# Patient Record
Sex: Male | Born: 1940 | Race: White | Hispanic: No | Marital: Married | Smoking: Former smoker
Health system: Southern US, Community
[De-identification: ages and names within clinical notes are randomized; demographics above are authoritative.]

## PROBLEM LIST (undated history)

## (undated) DIAGNOSIS — I519 Heart disease, unspecified: Secondary | ICD-10-CM

## (undated) DIAGNOSIS — E785 Hyperlipidemia, unspecified: Secondary | ICD-10-CM

## (undated) DIAGNOSIS — I10 Essential (primary) hypertension: Secondary | ICD-10-CM

## (undated) DIAGNOSIS — N400 Enlarged prostate without lower urinary tract symptoms: Secondary | ICD-10-CM

## (undated) DIAGNOSIS — K279 Peptic ulcer, site unspecified, unspecified as acute or chronic, without hemorrhage or perforation: Secondary | ICD-10-CM

## (undated) DIAGNOSIS — K5190 Ulcerative colitis, unspecified, without complications: Secondary | ICD-10-CM

## (undated) DIAGNOSIS — E119 Type 2 diabetes mellitus without complications: Secondary | ICD-10-CM

## (undated) DIAGNOSIS — K861 Other chronic pancreatitis: Secondary | ICD-10-CM

## (undated) DIAGNOSIS — M109 Gout, unspecified: Secondary | ICD-10-CM

## (undated) HISTORY — PX: APPENDECTOMY: SHX54

## (undated) HISTORY — DX: Peptic ulcer, site unspecified, unspecified as acute or chronic, without hemorrhage or perforation: K27.9

## (undated) HISTORY — PX: CHOLECYSTECTOMY: SHX55

## (undated) HISTORY — PX: CORONARY ARTERY BYPASS GRAFT: SHX141

## (undated) HISTORY — DX: Heart disease, unspecified: I51.9

## (undated) HISTORY — DX: Hyperlipidemia, unspecified: E78.5

## (undated) HISTORY — DX: Gout, unspecified: M10.9

## (undated) HISTORY — DX: Other chronic pancreatitis: K86.1

## (undated) HISTORY — DX: Ulcerative colitis, unspecified, without complications: K51.90

## (undated) HISTORY — DX: Benign prostatic hyperplasia without lower urinary tract symptoms: N40.0

---

## 2004-09-24 ENCOUNTER — Ambulatory Visit: Payer: Self-pay | Admitting: Gastroenterology

## 2005-10-21 ENCOUNTER — Ambulatory Visit: Payer: Self-pay | Admitting: Gastroenterology

## 2006-01-24 ENCOUNTER — Ambulatory Visit: Payer: Self-pay | Admitting: Gastroenterology

## 2006-08-04 ENCOUNTER — Ambulatory Visit: Payer: Self-pay | Admitting: Gastroenterology

## 2006-08-15 ENCOUNTER — Inpatient Hospital Stay: Payer: Self-pay | Admitting: Unknown Physician Specialty

## 2006-10-09 ENCOUNTER — Ambulatory Visit: Payer: Self-pay | Admitting: Internal Medicine

## 2006-10-11 ENCOUNTER — Inpatient Hospital Stay: Payer: Self-pay | Admitting: Internal Medicine

## 2006-10-31 ENCOUNTER — Ambulatory Visit: Payer: Self-pay | Admitting: Internal Medicine

## 2006-11-08 ENCOUNTER — Ambulatory Visit: Payer: Self-pay | Admitting: Internal Medicine

## 2006-12-22 ENCOUNTER — Ambulatory Visit: Payer: Self-pay | Admitting: Internal Medicine

## 2007-04-16 ENCOUNTER — Ambulatory Visit: Payer: Self-pay | Admitting: Unknown Physician Specialty

## 2008-12-25 ENCOUNTER — Ambulatory Visit: Payer: Self-pay | Admitting: Gastroenterology

## 2009-06-30 ENCOUNTER — Ambulatory Visit: Payer: Self-pay | Admitting: Unknown Physician Specialty

## 2009-07-20 ENCOUNTER — Ambulatory Visit: Payer: Self-pay | Admitting: Family

## 2010-06-29 ENCOUNTER — Ambulatory Visit: Payer: Self-pay | Admitting: Unknown Physician Specialty

## 2010-10-04 ENCOUNTER — Ambulatory Visit: Payer: Self-pay | Admitting: Internal Medicine

## 2011-06-28 ENCOUNTER — Ambulatory Visit: Payer: Self-pay | Admitting: Unknown Physician Specialty

## 2011-06-29 LAB — PATHOLOGY REPORT

## 2011-07-23 ENCOUNTER — Emergency Department: Payer: Self-pay | Admitting: Internal Medicine

## 2011-07-23 LAB — URINALYSIS, COMPLETE
Bacteria: NONE SEEN
Bilirubin,UR: NEGATIVE
Blood: NEGATIVE
Glucose,UR: 150 mg/dL
Ketone: NEGATIVE
Leukocyte Esterase: NEGATIVE
Nitrite: NEGATIVE
Ph: 6
Protein: NEGATIVE
RBC,UR: 1 /HPF
Specific Gravity: 1.015
Squamous Epithelial: NONE SEEN
WBC UR: <1 /HPF

## 2011-08-30 ENCOUNTER — Ambulatory Visit: Payer: Self-pay | Admitting: Urology

## 2011-08-30 LAB — CBC WITH DIFFERENTIAL/PLATELET
Basophil #: 0.1 10*3/uL (ref 0.0–0.1)
Basophil %: 0.7 %
HCT: 40.1 % (ref 40.0–52.0)
HGB: 12.7 g/dL — ABNORMAL LOW (ref 13.0–18.0)
Lymphocyte %: 21.8 %
MCH: 28.9 pg (ref 26.0–34.0)
MCHC: 31.6 g/dL — ABNORMAL LOW (ref 32.0–36.0)
Monocyte %: 7.5 %
Neutrophil #: 7.7 10*3/uL — ABNORMAL HIGH (ref 1.4–6.5)
Platelet: 194 10*3/uL (ref 150–440)
RDW: 13.4 % (ref 11.5–14.5)
WBC: 11.3 10*3/uL — ABNORMAL HIGH (ref 3.8–10.6)

## 2011-08-30 LAB — BASIC METABOLIC PANEL
Anion Gap: 4 — ABNORMAL LOW (ref 7–16)
Calcium, Total: 9.2 mg/dL (ref 8.5–10.1)
Co2: 32 mmol/L (ref 21–32)
EGFR (African American): >60
EGFR (Non-African Amer.): >60
Glucose: 146 mg/dL — ABNORMAL HIGH (ref 65–99)
Osmolality: 287 (ref 275–301)
Potassium: 4.5 mmol/L (ref 3.5–5.1)
Sodium: 142 mmol/L (ref 136–145)

## 2011-08-31 ENCOUNTER — Ambulatory Visit: Payer: Self-pay | Admitting: Anesthesiology

## 2011-08-31 DIAGNOSIS — I10 Essential (primary) hypertension: Secondary | ICD-10-CM

## 2011-09-05 ENCOUNTER — Ambulatory Visit: Payer: Self-pay | Admitting: Urology

## 2011-10-14 DIAGNOSIS — D490 Neoplasm of unspecified behavior of digestive system: Secondary | ICD-10-CM | POA: Insufficient documentation

## 2011-11-02 ENCOUNTER — Other Ambulatory Visit: Payer: Self-pay | Admitting: Gastroenterology

## 2011-11-02 LAB — CLOSTRIDIUM DIFFICILE BY PCR

## 2012-09-12 ENCOUNTER — Encounter (INDEPENDENT_AMBULATORY_CARE_PROVIDER_SITE_OTHER): Payer: Medicare Other | Admitting: Ophthalmology

## 2012-09-12 DIAGNOSIS — E1139 Type 2 diabetes mellitus with other diabetic ophthalmic complication: Secondary | ICD-10-CM

## 2012-09-12 DIAGNOSIS — H43819 Vitreous degeneration, unspecified eye: Secondary | ICD-10-CM

## 2012-09-12 DIAGNOSIS — H348192 Central retinal vein occlusion, unspecified eye, stable: Secondary | ICD-10-CM

## 2012-09-12 DIAGNOSIS — E11319 Type 2 diabetes mellitus with unspecified diabetic retinopathy without macular edema: Secondary | ICD-10-CM

## 2012-09-12 DIAGNOSIS — I10 Essential (primary) hypertension: Secondary | ICD-10-CM

## 2012-09-12 DIAGNOSIS — H35039 Hypertensive retinopathy, unspecified eye: Secondary | ICD-10-CM

## 2012-10-12 ENCOUNTER — Encounter (INDEPENDENT_AMBULATORY_CARE_PROVIDER_SITE_OTHER): Payer: Medicare Other | Admitting: Ophthalmology

## 2012-10-12 DIAGNOSIS — E1139 Type 2 diabetes mellitus with other diabetic ophthalmic complication: Secondary | ICD-10-CM

## 2012-10-12 DIAGNOSIS — E11319 Type 2 diabetes mellitus with unspecified diabetic retinopathy without macular edema: Secondary | ICD-10-CM

## 2012-10-12 DIAGNOSIS — H348192 Central retinal vein occlusion, unspecified eye, stable: Secondary | ICD-10-CM

## 2012-10-12 DIAGNOSIS — H43819 Vitreous degeneration, unspecified eye: Secondary | ICD-10-CM

## 2012-10-12 DIAGNOSIS — H35039 Hypertensive retinopathy, unspecified eye: Secondary | ICD-10-CM

## 2012-10-12 DIAGNOSIS — I10 Essential (primary) hypertension: Secondary | ICD-10-CM

## 2012-11-12 ENCOUNTER — Encounter (INDEPENDENT_AMBULATORY_CARE_PROVIDER_SITE_OTHER): Payer: Medicare Other | Admitting: Ophthalmology

## 2012-11-12 DIAGNOSIS — E1139 Type 2 diabetes mellitus with other diabetic ophthalmic complication: Secondary | ICD-10-CM

## 2012-11-12 DIAGNOSIS — E11319 Type 2 diabetes mellitus with unspecified diabetic retinopathy without macular edema: Secondary | ICD-10-CM

## 2012-11-12 DIAGNOSIS — H35039 Hypertensive retinopathy, unspecified eye: Secondary | ICD-10-CM

## 2012-11-12 DIAGNOSIS — I10 Essential (primary) hypertension: Secondary | ICD-10-CM

## 2012-11-12 DIAGNOSIS — H348192 Central retinal vein occlusion, unspecified eye, stable: Secondary | ICD-10-CM

## 2012-11-12 DIAGNOSIS — H43819 Vitreous degeneration, unspecified eye: Secondary | ICD-10-CM

## 2012-12-10 ENCOUNTER — Encounter (INDEPENDENT_AMBULATORY_CARE_PROVIDER_SITE_OTHER): Payer: Medicare Other | Admitting: Ophthalmology

## 2012-12-10 DIAGNOSIS — H348192 Central retinal vein occlusion, unspecified eye, stable: Secondary | ICD-10-CM

## 2012-12-10 DIAGNOSIS — H43819 Vitreous degeneration, unspecified eye: Secondary | ICD-10-CM

## 2012-12-10 DIAGNOSIS — E1139 Type 2 diabetes mellitus with other diabetic ophthalmic complication: Secondary | ICD-10-CM

## 2012-12-10 DIAGNOSIS — H35039 Hypertensive retinopathy, unspecified eye: Secondary | ICD-10-CM

## 2012-12-10 DIAGNOSIS — E11319 Type 2 diabetes mellitus with unspecified diabetic retinopathy without macular edema: Secondary | ICD-10-CM

## 2012-12-10 DIAGNOSIS — I10 Essential (primary) hypertension: Secondary | ICD-10-CM

## 2013-01-07 ENCOUNTER — Encounter (INDEPENDENT_AMBULATORY_CARE_PROVIDER_SITE_OTHER): Payer: Medicare Other | Admitting: Ophthalmology

## 2013-01-07 DIAGNOSIS — I10 Essential (primary) hypertension: Secondary | ICD-10-CM

## 2013-01-07 DIAGNOSIS — H348192 Central retinal vein occlusion, unspecified eye, stable: Secondary | ICD-10-CM

## 2013-01-07 DIAGNOSIS — E11319 Type 2 diabetes mellitus with unspecified diabetic retinopathy without macular edema: Secondary | ICD-10-CM

## 2013-01-07 DIAGNOSIS — E1139 Type 2 diabetes mellitus with other diabetic ophthalmic complication: Secondary | ICD-10-CM

## 2013-01-07 DIAGNOSIS — H43819 Vitreous degeneration, unspecified eye: Secondary | ICD-10-CM

## 2013-01-07 DIAGNOSIS — H35039 Hypertensive retinopathy, unspecified eye: Secondary | ICD-10-CM

## 2013-02-15 ENCOUNTER — Encounter (INDEPENDENT_AMBULATORY_CARE_PROVIDER_SITE_OTHER): Payer: Medicare Other | Admitting: Ophthalmology

## 2013-02-15 DIAGNOSIS — E1139 Type 2 diabetes mellitus with other diabetic ophthalmic complication: Secondary | ICD-10-CM

## 2013-02-15 DIAGNOSIS — D3130 Benign neoplasm of unspecified choroid: Secondary | ICD-10-CM

## 2013-02-15 DIAGNOSIS — H35039 Hypertensive retinopathy, unspecified eye: Secondary | ICD-10-CM

## 2013-02-15 DIAGNOSIS — E11319 Type 2 diabetes mellitus with unspecified diabetic retinopathy without macular edema: Secondary | ICD-10-CM

## 2013-02-15 DIAGNOSIS — I10 Essential (primary) hypertension: Secondary | ICD-10-CM

## 2013-02-15 DIAGNOSIS — E1165 Type 2 diabetes mellitus with hyperglycemia: Secondary | ICD-10-CM

## 2013-02-15 DIAGNOSIS — H348192 Central retinal vein occlusion, unspecified eye, stable: Secondary | ICD-10-CM

## 2013-03-28 ENCOUNTER — Encounter (INDEPENDENT_AMBULATORY_CARE_PROVIDER_SITE_OTHER): Payer: Medicare Other | Admitting: Ophthalmology

## 2013-03-28 DIAGNOSIS — H43819 Vitreous degeneration, unspecified eye: Secondary | ICD-10-CM

## 2013-03-28 DIAGNOSIS — I10 Essential (primary) hypertension: Secondary | ICD-10-CM

## 2013-03-28 DIAGNOSIS — H348192 Central retinal vein occlusion, unspecified eye, stable: Secondary | ICD-10-CM

## 2013-03-28 DIAGNOSIS — H35039 Hypertensive retinopathy, unspecified eye: Secondary | ICD-10-CM

## 2013-05-09 ENCOUNTER — Encounter (INDEPENDENT_AMBULATORY_CARE_PROVIDER_SITE_OTHER): Payer: Medicare Other | Admitting: Ophthalmology

## 2013-05-09 DIAGNOSIS — H43819 Vitreous degeneration, unspecified eye: Secondary | ICD-10-CM

## 2013-05-09 DIAGNOSIS — H348192 Central retinal vein occlusion, unspecified eye, stable: Secondary | ICD-10-CM

## 2013-05-09 DIAGNOSIS — H35039 Hypertensive retinopathy, unspecified eye: Secondary | ICD-10-CM

## 2013-05-09 DIAGNOSIS — D3130 Benign neoplasm of unspecified choroid: Secondary | ICD-10-CM

## 2013-05-09 DIAGNOSIS — I10 Essential (primary) hypertension: Secondary | ICD-10-CM

## 2013-05-09 DIAGNOSIS — E1165 Type 2 diabetes mellitus with hyperglycemia: Secondary | ICD-10-CM

## 2013-05-09 DIAGNOSIS — E1139 Type 2 diabetes mellitus with other diabetic ophthalmic complication: Secondary | ICD-10-CM

## 2013-05-09 DIAGNOSIS — H2510 Age-related nuclear cataract, unspecified eye: Secondary | ICD-10-CM

## 2013-06-20 ENCOUNTER — Inpatient Hospital Stay: Payer: Self-pay | Admitting: Internal Medicine

## 2013-06-20 ENCOUNTER — Ambulatory Visit: Payer: Self-pay | Admitting: Unknown Physician Specialty

## 2013-06-20 LAB — COMPREHENSIVE METABOLIC PANEL
ALBUMIN: 3.7 g/dL (ref 3.4–5.0)
ANION GAP: 11 (ref 7–16)
AST: 21 U/L (ref 15–37)
Alkaline Phosphatase: 95 U/L
BUN: 17 mg/dL (ref 7–18)
Bilirubin,Total: 0.6 mg/dL (ref 0.2–1.0)
CALCIUM: 9.4 mg/dL (ref 8.5–10.1)
CO2: 25 mmol/L (ref 21–32)
CREATININE: 1.21 mg/dL (ref 0.60–1.30)
Chloride: 102 mmol/L (ref 98–107)
EGFR (African American): >60
GFR CALC NON AF AMER: 58 — AB
Glucose: 180 mg/dL — ABNORMAL HIGH (ref 65–99)
Osmolality: 282 (ref 275–301)
POTASSIUM: 3.5 mmol/L (ref 3.5–5.1)
SGPT (ALT): 34 U/L (ref 12–78)
Sodium: 138 mmol/L (ref 136–145)
Total Protein: 7.5 g/dL (ref 6.4–8.2)

## 2013-06-20 LAB — URINALYSIS, COMPLETE
BACTERIA: NONE SEEN
BILIRUBIN, UR: NEGATIVE
Blood: NEGATIVE
LEUKOCYTE ESTERASE: NEGATIVE
NITRITE: POSITIVE
Ph: 6 (ref 4.5–8.0)
RBC,UR: 14 /HPF (ref 0–5)
SQUAMOUS EPITHELIAL: NONE SEEN
Specific Gravity: 1.013 (ref 1.003–1.030)
WBC UR: 5 /HPF (ref 0–5)

## 2013-06-20 LAB — CBC
HCT: 43.4 % (ref 40.0–52.0)
HGB: 14.2 g/dL (ref 13.0–18.0)
MCH: 30 pg (ref 26.0–34.0)
MCHC: 32.8 g/dL (ref 32.0–36.0)
MCV: 92 fL (ref 80–100)
PLATELETS: 141 10*3/uL — AB (ref 150–440)
RBC: 4.74 10*6/uL (ref 4.40–5.90)
RDW: 14.1 % (ref 11.5–14.5)
WBC: 11.1 10*3/uL — ABNORMAL HIGH (ref 3.8–10.6)

## 2013-06-20 LAB — APTT: ACTIVATED PTT: 28.1 s (ref 23.6–35.9)

## 2013-06-21 HISTORY — PX: TOTAL HIP ARTHROPLASTY: SHX124

## 2013-06-21 LAB — CBC WITH DIFFERENTIAL/PLATELET
Basophil #: 0 10*3/uL (ref 0.0–0.1)
Basophil %: 0.5 %
EOS ABS: 0.1 10*3/uL (ref 0.0–0.7)
EOS PCT: 0.8 %
HCT: 38 % — AB (ref 40.0–52.0)
HGB: 12.7 g/dL — AB (ref 13.0–18.0)
LYMPHS PCT: 22.1 %
Lymphocyte #: 1.8 10*3/uL (ref 1.0–3.6)
MCH: 30.2 pg (ref 26.0–34.0)
MCHC: 33.4 g/dL (ref 32.0–36.0)
MCV: 91 fL (ref 80–100)
MONOS PCT: 8.4 %
Monocyte #: 0.7 x10 3/mm (ref 0.2–1.0)
NEUTROS ABS: 5.5 10*3/uL (ref 1.4–6.5)
Neutrophil %: 68.2 %
Platelet: 128 10*3/uL — ABNORMAL LOW (ref 150–440)
RBC: 4.2 10*6/uL — ABNORMAL LOW (ref 4.40–5.90)
RDW: 14 % (ref 11.5–14.5)
WBC: 8.1 10*3/uL (ref 3.8–10.6)

## 2013-06-21 LAB — BASIC METABOLIC PANEL
ANION GAP: 5 — AB (ref 7–16)
BUN: 14 mg/dL (ref 7–18)
CHLORIDE: 106 mmol/L (ref 98–107)
Calcium, Total: 8.6 mg/dL (ref 8.5–10.1)
Co2: 27 mmol/L (ref 21–32)
Creatinine: 1.02 mg/dL (ref 0.60–1.30)
Glucose: 143 mg/dL — ABNORMAL HIGH (ref 65–99)
OSMOLALITY: 279 (ref 275–301)
POTASSIUM: 3.9 mmol/L (ref 3.5–5.1)
Sodium: 138 mmol/L (ref 136–145)

## 2013-06-22 LAB — CBC WITH DIFFERENTIAL/PLATELET
Basophil #: 0 10*3/uL (ref 0.0–0.1)
Basophil %: 0.4 %
EOS ABS: 0.1 10*3/uL (ref 0.0–0.7)
Eosinophil %: 0.8 %
HCT: 35.8 % — ABNORMAL LOW (ref 40.0–52.0)
HGB: 11.9 g/dL — AB (ref 13.0–18.0)
LYMPHS ABS: 1.4 10*3/uL (ref 1.0–3.6)
LYMPHS PCT: 15.9 %
MCH: 30.2 pg (ref 26.0–34.0)
MCHC: 33.2 g/dL (ref 32.0–36.0)
MCV: 91 fL (ref 80–100)
Monocyte #: 0.7 x10 3/mm (ref 0.2–1.0)
Monocyte %: 8.2 %
Neutrophil #: 6.4 10*3/uL (ref 1.4–6.5)
Neutrophil %: 74.7 %
PLATELETS: 135 10*3/uL — AB (ref 150–440)
RBC: 3.93 10*6/uL — ABNORMAL LOW (ref 4.40–5.90)
RDW: 14 % (ref 11.5–14.5)
WBC: 8.6 10*3/uL (ref 3.8–10.6)

## 2013-06-22 LAB — BASIC METABOLIC PANEL
ANION GAP: 3 — AB (ref 7–16)
BUN: 12 mg/dL (ref 7–18)
CALCIUM: 8 mg/dL — AB (ref 8.5–10.1)
Chloride: 105 mmol/L (ref 98–107)
Co2: 27 mmol/L (ref 21–32)
Creatinine: 1.19 mg/dL (ref 0.60–1.30)
EGFR (African American): >60
EGFR (Non-African Amer.): 59 — ABNORMAL LOW
Glucose: 153 mg/dL — ABNORMAL HIGH (ref 65–99)
OSMOLALITY: 273 (ref 275–301)
Potassium: 4.2 mmol/L (ref 3.5–5.1)
Sodium: 135 mmol/L — ABNORMAL LOW (ref 136–145)

## 2013-06-22 LAB — URINE CULTURE

## 2013-06-23 LAB — CBC WITH DIFFERENTIAL/PLATELET
BASOS ABS: 0 10*3/uL (ref 0.0–0.1)
Basophil %: 0.3 %
EOS PCT: 0.4 %
Eosinophil #: 0 10*3/uL (ref 0.0–0.7)
HCT: 31.5 % — ABNORMAL LOW (ref 40.0–52.0)
HGB: 10.6 g/dL — AB (ref 13.0–18.0)
Lymphocyte #: 1.1 10*3/uL (ref 1.0–3.6)
Lymphocyte %: 15 %
MCH: 30.3 pg (ref 26.0–34.0)
MCHC: 33.5 g/dL (ref 32.0–36.0)
MCV: 90 fL (ref 80–100)
Monocyte #: 0.8 x10 3/mm (ref 0.2–1.0)
Monocyte %: 10.5 %
NEUTROS PCT: 73.8 %
Neutrophil #: 5.3 10*3/uL (ref 1.4–6.5)
PLATELETS: 113 10*3/uL — AB (ref 150–440)
RBC: 3.48 10*6/uL — ABNORMAL LOW (ref 4.40–5.90)
RDW: 14.6 % — AB (ref 11.5–14.5)
WBC: 7.2 10*3/uL (ref 3.8–10.6)

## 2013-06-23 LAB — BASIC METABOLIC PANEL
ANION GAP: 5 — AB (ref 7–16)
BUN: 13 mg/dL (ref 7–18)
CALCIUM: 8.1 mg/dL — AB (ref 8.5–10.1)
CHLORIDE: 102 mmol/L (ref 98–107)
Co2: 27 mmol/L (ref 21–32)
Creatinine: 1.16 mg/dL (ref 0.60–1.30)
EGFR (African American): >60
GLUCOSE: 179 mg/dL — AB (ref 65–99)
Osmolality: 273 (ref 275–301)
Potassium: 4.1 mmol/L (ref 3.5–5.1)
SODIUM: 134 mmol/L — AB (ref 136–145)

## 2013-06-24 LAB — CBC
HCT: 29.4 % — ABNORMAL LOW (ref 40.0–52.0)
HGB: 9.9 g/dL — ABNORMAL LOW (ref 13.0–18.0)
MCH: 30.1 pg (ref 26.0–34.0)
MCHC: 33.5 g/dL (ref 32.0–36.0)
MCV: 90 fL (ref 80–100)
Platelet: 120 10*3/uL — ABNORMAL LOW (ref 150–440)
RBC: 3.28 10*6/uL — AB (ref 4.40–5.90)
RDW: 14 % (ref 11.5–14.5)
WBC: 5.9 10*3/uL (ref 3.8–10.6)

## 2013-06-25 LAB — BASIC METABOLIC PANEL
ANION GAP: 3 — AB (ref 7–16)
BUN: 13 mg/dL (ref 7–18)
CHLORIDE: 102 mmol/L (ref 98–107)
Calcium, Total: 8.1 mg/dL — ABNORMAL LOW (ref 8.5–10.1)
Co2: 31 mmol/L (ref 21–32)
Creatinine: 0.99 mg/dL (ref 0.60–1.30)
EGFR (Non-African Amer.): >60
Glucose: 79 mg/dL (ref 65–99)
OSMOLALITY: 271 (ref 275–301)
Potassium: 3.3 mmol/L — ABNORMAL LOW (ref 3.5–5.1)
Sodium: 136 mmol/L (ref 136–145)

## 2013-06-25 LAB — CBC WITH DIFFERENTIAL/PLATELET
BASOS ABS: 0 10*3/uL (ref 0.0–0.1)
Basophil %: 0.5 %
EOS PCT: 0.9 %
Eosinophil #: 0.1 10*3/uL (ref 0.0–0.7)
HCT: 25.8 % — ABNORMAL LOW (ref 40.0–52.0)
HGB: 8.8 g/dL — ABNORMAL LOW (ref 13.0–18.0)
Lymphocyte #: 1.7 10*3/uL (ref 1.0–3.6)
Lymphocyte %: 25.4 %
MCH: 30.6 pg (ref 26.0–34.0)
MCHC: 34 g/dL (ref 32.0–36.0)
MCV: 90 fL (ref 80–100)
Monocyte #: 0.7 x10 3/mm (ref 0.2–1.0)
Monocyte %: 10.3 %
NEUTROS ABS: 4.2 10*3/uL (ref 1.4–6.5)
NEUTROS PCT: 62.9 %
Platelet: 130 10*3/uL — ABNORMAL LOW (ref 150–440)
RBC: 2.87 10*6/uL — ABNORMAL LOW (ref 4.40–5.90)
RDW: 14.2 % (ref 11.5–14.5)
WBC: 6.6 10*3/uL (ref 3.8–10.6)

## 2013-06-25 LAB — PATHOLOGY REPORT

## 2013-06-26 ENCOUNTER — Encounter: Payer: Self-pay | Admitting: Internal Medicine

## 2013-06-26 LAB — URINALYSIS, COMPLETE
BILIRUBIN, UR: NEGATIVE
Bacteria: NONE SEEN
Blood: NEGATIVE
Glucose,UR: 50 mg/dL (ref 0–75)
Ketone: NEGATIVE
Leukocyte Esterase: NEGATIVE
Nitrite: NEGATIVE
Ph: 6 (ref 4.5–8.0)
Protein: NEGATIVE
Specific Gravity: 1.006 (ref 1.003–1.030)
WBC UR: 4 /HPF (ref 0–5)

## 2013-06-26 LAB — HEMOGLOBIN: HGB: 9.7 g/dL — AB (ref 13.0–18.0)

## 2013-06-26 LAB — POTASSIUM: Potassium: 4.1 mmol/L (ref 3.5–5.1)

## 2013-07-05 DIAGNOSIS — Z96649 Presence of unspecified artificial hip joint: Secondary | ICD-10-CM | POA: Insufficient documentation

## 2013-07-08 ENCOUNTER — Encounter (INDEPENDENT_AMBULATORY_CARE_PROVIDER_SITE_OTHER): Payer: Medicare Other | Admitting: Ophthalmology

## 2013-07-08 DIAGNOSIS — I10 Essential (primary) hypertension: Secondary | ICD-10-CM

## 2013-07-08 DIAGNOSIS — E1165 Type 2 diabetes mellitus with hyperglycemia: Secondary | ICD-10-CM

## 2013-07-08 DIAGNOSIS — E11319 Type 2 diabetes mellitus with unspecified diabetic retinopathy without macular edema: Secondary | ICD-10-CM

## 2013-07-08 DIAGNOSIS — E1139 Type 2 diabetes mellitus with other diabetic ophthalmic complication: Secondary | ICD-10-CM

## 2013-07-08 DIAGNOSIS — H35039 Hypertensive retinopathy, unspecified eye: Secondary | ICD-10-CM

## 2013-07-08 DIAGNOSIS — H348192 Central retinal vein occlusion, unspecified eye, stable: Secondary | ICD-10-CM

## 2013-08-05 ENCOUNTER — Encounter (INDEPENDENT_AMBULATORY_CARE_PROVIDER_SITE_OTHER): Payer: Medicare Other | Admitting: Ophthalmology

## 2013-08-05 DIAGNOSIS — E1139 Type 2 diabetes mellitus with other diabetic ophthalmic complication: Secondary | ICD-10-CM

## 2013-08-05 DIAGNOSIS — H35039 Hypertensive retinopathy, unspecified eye: Secondary | ICD-10-CM

## 2013-08-05 DIAGNOSIS — I10 Essential (primary) hypertension: Secondary | ICD-10-CM

## 2013-08-05 DIAGNOSIS — H43819 Vitreous degeneration, unspecified eye: Secondary | ICD-10-CM

## 2013-08-05 DIAGNOSIS — H348192 Central retinal vein occlusion, unspecified eye, stable: Secondary | ICD-10-CM

## 2013-08-05 DIAGNOSIS — E11319 Type 2 diabetes mellitus with unspecified diabetic retinopathy without macular edema: Secondary | ICD-10-CM

## 2013-08-05 DIAGNOSIS — D3130 Benign neoplasm of unspecified choroid: Secondary | ICD-10-CM

## 2013-08-05 DIAGNOSIS — E1165 Type 2 diabetes mellitus with hyperglycemia: Secondary | ICD-10-CM

## 2013-09-09 ENCOUNTER — Encounter (INDEPENDENT_AMBULATORY_CARE_PROVIDER_SITE_OTHER): Payer: Medicare Other | Admitting: Ophthalmology

## 2013-09-09 DIAGNOSIS — E1165 Type 2 diabetes mellitus with hyperglycemia: Secondary | ICD-10-CM

## 2013-09-09 DIAGNOSIS — E11319 Type 2 diabetes mellitus with unspecified diabetic retinopathy without macular edema: Secondary | ICD-10-CM

## 2013-09-09 DIAGNOSIS — H35039 Hypertensive retinopathy, unspecified eye: Secondary | ICD-10-CM

## 2013-09-09 DIAGNOSIS — H348192 Central retinal vein occlusion, unspecified eye, stable: Secondary | ICD-10-CM

## 2013-09-09 DIAGNOSIS — I10 Essential (primary) hypertension: Secondary | ICD-10-CM

## 2013-09-09 DIAGNOSIS — D3130 Benign neoplasm of unspecified choroid: Secondary | ICD-10-CM

## 2013-09-09 DIAGNOSIS — E1139 Type 2 diabetes mellitus with other diabetic ophthalmic complication: Secondary | ICD-10-CM

## 2013-10-21 ENCOUNTER — Encounter (INDEPENDENT_AMBULATORY_CARE_PROVIDER_SITE_OTHER): Payer: Medicare Other | Admitting: Ophthalmology

## 2013-10-21 DIAGNOSIS — I10 Essential (primary) hypertension: Secondary | ICD-10-CM

## 2013-10-21 DIAGNOSIS — H43819 Vitreous degeneration, unspecified eye: Secondary | ICD-10-CM

## 2013-10-21 DIAGNOSIS — H348192 Central retinal vein occlusion, unspecified eye, stable: Secondary | ICD-10-CM

## 2013-10-21 DIAGNOSIS — E1139 Type 2 diabetes mellitus with other diabetic ophthalmic complication: Secondary | ICD-10-CM

## 2013-10-21 DIAGNOSIS — D3130 Benign neoplasm of unspecified choroid: Secondary | ICD-10-CM

## 2013-10-21 DIAGNOSIS — E1165 Type 2 diabetes mellitus with hyperglycemia: Secondary | ICD-10-CM

## 2013-10-21 DIAGNOSIS — H35039 Hypertensive retinopathy, unspecified eye: Secondary | ICD-10-CM

## 2013-10-21 DIAGNOSIS — E11319 Type 2 diabetes mellitus with unspecified diabetic retinopathy without macular edema: Secondary | ICD-10-CM

## 2013-11-25 ENCOUNTER — Encounter (INDEPENDENT_AMBULATORY_CARE_PROVIDER_SITE_OTHER): Payer: Medicare Other | Admitting: Ophthalmology

## 2013-11-25 DIAGNOSIS — E11329 Type 2 diabetes mellitus with mild nonproliferative diabetic retinopathy without macular edema: Secondary | ICD-10-CM

## 2013-11-25 DIAGNOSIS — H34811 Central retinal vein occlusion, right eye: Secondary | ICD-10-CM

## 2013-11-25 DIAGNOSIS — E11311 Type 2 diabetes mellitus with unspecified diabetic retinopathy with macular edema: Secondary | ICD-10-CM

## 2013-11-25 DIAGNOSIS — H35033 Hypertensive retinopathy, bilateral: Secondary | ICD-10-CM

## 2013-11-25 DIAGNOSIS — H43813 Vitreous degeneration, bilateral: Secondary | ICD-10-CM

## 2013-11-25 DIAGNOSIS — E11321 Type 2 diabetes mellitus with mild nonproliferative diabetic retinopathy with macular edema: Secondary | ICD-10-CM

## 2013-11-25 DIAGNOSIS — I10 Essential (primary) hypertension: Secondary | ICD-10-CM

## 2013-12-27 ENCOUNTER — Encounter (INDEPENDENT_AMBULATORY_CARE_PROVIDER_SITE_OTHER): Payer: Medicare Other | Admitting: Ophthalmology

## 2013-12-27 DIAGNOSIS — E11329 Type 2 diabetes mellitus with mild nonproliferative diabetic retinopathy without macular edema: Secondary | ICD-10-CM

## 2013-12-27 DIAGNOSIS — H34811 Central retinal vein occlusion, right eye: Secondary | ICD-10-CM

## 2013-12-27 DIAGNOSIS — H43813 Vitreous degeneration, bilateral: Secondary | ICD-10-CM

## 2013-12-27 DIAGNOSIS — D3132 Benign neoplasm of left choroid: Secondary | ICD-10-CM

## 2013-12-27 DIAGNOSIS — I10 Essential (primary) hypertension: Secondary | ICD-10-CM

## 2013-12-27 DIAGNOSIS — E11339 Type 2 diabetes mellitus with moderate nonproliferative diabetic retinopathy without macular edema: Secondary | ICD-10-CM

## 2013-12-27 DIAGNOSIS — H35033 Hypertensive retinopathy, bilateral: Secondary | ICD-10-CM

## 2013-12-27 DIAGNOSIS — E11319 Type 2 diabetes mellitus with unspecified diabetic retinopathy without macular edema: Secondary | ICD-10-CM

## 2014-02-04 ENCOUNTER — Encounter (INDEPENDENT_AMBULATORY_CARE_PROVIDER_SITE_OTHER): Payer: Medicare Other | Admitting: Ophthalmology

## 2014-02-04 DIAGNOSIS — H43813 Vitreous degeneration, bilateral: Secondary | ICD-10-CM

## 2014-02-04 DIAGNOSIS — E11329 Type 2 diabetes mellitus with mild nonproliferative diabetic retinopathy without macular edema: Secondary | ICD-10-CM

## 2014-02-04 DIAGNOSIS — H34811 Central retinal vein occlusion, right eye: Secondary | ICD-10-CM

## 2014-02-04 DIAGNOSIS — E11319 Type 2 diabetes mellitus with unspecified diabetic retinopathy without macular edema: Secondary | ICD-10-CM

## 2014-02-04 DIAGNOSIS — I10 Essential (primary) hypertension: Secondary | ICD-10-CM

## 2014-02-04 DIAGNOSIS — D3132 Benign neoplasm of left choroid: Secondary | ICD-10-CM

## 2014-02-04 DIAGNOSIS — H35033 Hypertensive retinopathy, bilateral: Secondary | ICD-10-CM

## 2014-03-06 ENCOUNTER — Encounter (INDEPENDENT_AMBULATORY_CARE_PROVIDER_SITE_OTHER): Payer: Medicare Other | Admitting: Ophthalmology

## 2014-03-06 DIAGNOSIS — E11329 Type 2 diabetes mellitus with mild nonproliferative diabetic retinopathy without macular edema: Secondary | ICD-10-CM

## 2014-03-06 DIAGNOSIS — I10 Essential (primary) hypertension: Secondary | ICD-10-CM

## 2014-03-06 DIAGNOSIS — E11319 Type 2 diabetes mellitus with unspecified diabetic retinopathy without macular edema: Secondary | ICD-10-CM

## 2014-03-06 DIAGNOSIS — H35033 Hypertensive retinopathy, bilateral: Secondary | ICD-10-CM

## 2014-03-06 DIAGNOSIS — D3132 Benign neoplasm of left choroid: Secondary | ICD-10-CM

## 2014-03-06 DIAGNOSIS — H34811 Central retinal vein occlusion, right eye: Secondary | ICD-10-CM

## 2014-03-06 DIAGNOSIS — H43813 Vitreous degeneration, bilateral: Secondary | ICD-10-CM

## 2014-04-15 ENCOUNTER — Encounter (INDEPENDENT_AMBULATORY_CARE_PROVIDER_SITE_OTHER): Payer: Medicare Other | Admitting: Ophthalmology

## 2014-04-15 DIAGNOSIS — H43813 Vitreous degeneration, bilateral: Secondary | ICD-10-CM

## 2014-04-15 DIAGNOSIS — E11319 Type 2 diabetes mellitus with unspecified diabetic retinopathy without macular edema: Secondary | ICD-10-CM | POA: Diagnosis not present

## 2014-04-15 DIAGNOSIS — H34811 Central retinal vein occlusion, right eye: Secondary | ICD-10-CM

## 2014-04-15 DIAGNOSIS — E11329 Type 2 diabetes mellitus with mild nonproliferative diabetic retinopathy without macular edema: Secondary | ICD-10-CM

## 2014-04-15 DIAGNOSIS — H35033 Hypertensive retinopathy, bilateral: Secondary | ICD-10-CM | POA: Diagnosis not present

## 2014-04-15 DIAGNOSIS — D3132 Benign neoplasm of left choroid: Secondary | ICD-10-CM

## 2014-04-15 DIAGNOSIS — I10 Essential (primary) hypertension: Secondary | ICD-10-CM | POA: Diagnosis not present

## 2014-05-20 ENCOUNTER — Encounter (INDEPENDENT_AMBULATORY_CARE_PROVIDER_SITE_OTHER): Payer: Medicare Other | Admitting: Ophthalmology

## 2014-05-20 DIAGNOSIS — E11329 Type 2 diabetes mellitus with mild nonproliferative diabetic retinopathy without macular edema: Secondary | ICD-10-CM | POA: Diagnosis not present

## 2014-05-20 DIAGNOSIS — E11319 Type 2 diabetes mellitus with unspecified diabetic retinopathy without macular edema: Secondary | ICD-10-CM | POA: Diagnosis not present

## 2014-05-20 DIAGNOSIS — H34811 Central retinal vein occlusion, right eye: Secondary | ICD-10-CM

## 2014-05-20 DIAGNOSIS — H43813 Vitreous degeneration, bilateral: Secondary | ICD-10-CM | POA: Diagnosis not present

## 2014-05-20 DIAGNOSIS — I10 Essential (primary) hypertension: Secondary | ICD-10-CM | POA: Diagnosis not present

## 2014-05-20 DIAGNOSIS — H35033 Hypertensive retinopathy, bilateral: Secondary | ICD-10-CM

## 2014-05-27 NOTE — Op Note (Signed)
PATIENT NAME:  Angel Franklin, Angel Franklin I MR#:  [id number] DATE OF BIRTH:  Mar 14, 1940  DATE OF PROCEDURE:  09/05/2011  PREOPERATIVE DIAGNOSES:  1. Bladder outlet obstruction.  2. Urinary retention.   POSTOPERATIVE DIAGNOSES:  1. Bladder outlet obstruction.  2. Urinary retention.   PROCEDURE PERFORMED: Photovaporization of the prostate with green light laser.   SURGEON: Maryan Puls, MD  ANESTHETIST: Vashti Hey, MD  ANESTHESIA: General.   INDICATIONS: See the dictated history and physical. After informed consent, the patient requests the above procedure.   OPERATIVE SUMMARY: After adequate general anesthesia had been obtained, the patient was placed into dorsal lithotomy position and the perineum was prepped and draped in the usual fashion. The laser scope was coupled with the camera and then visually advanced into the bladder. The bladder was moderately trabeculated. Both ureteral orifices were identified and had clear efflux. The patient had trilobar benign prostatic hypertrophy with visual obstruction and intravesical growth of a large median lobe. At this point, the green light XPS laser fiber was introduced through the scope and power set at 40 watts. Median lobe and bladder neck tissue was vaporized. Power was then increased to 120 watts and lateral tissue was vaporized. Finally, power was turned up to 180 watts and remaining obstructive tissue was vaporized to the level of       the verumontanum. At this point, the scope was removed and a 20 [place] silicone catheter was placed. The catheter was irrigated until clear. A B and O suppository was placed. The procedure was then terminated and the patient was transferred to the recovery room in stable condition.  ____________________________ Otelia Limes. Yves Dill, MD mrw:slb D: 09/05/2011 10:27:13 ET T: 09/05/2011 12:16:15 ET JOB#: [id number]  cc: Otelia Limes. Yves Dill, MD, <Dictator>  Royston Cowper MD ELECTRONICALLY SIGNED 09/05/2011 16:07

## 2014-05-27 NOTE — H&P (Signed)
PATIENT NAME:  Angel Franklin, Angel Franklin MR#:  [id number] DATE OF BIRTH:  [birth year]  DATE OF ADMISSION:  09/05/2011  CHIEF COMPLAINT: Urinary retention.   HISTORY OF PRESENT ILLNESS: Angel Franklin is a 74 year old white male with a long history of lower urinary tract symptoms managed with Avodart and Flomax. He developed acute urinary retention requiring Foley catheterization in the Emergency Room June 15th. He had an episode of retention back in 2008 as well. He comes in now for photovaporization of the prostate with the green light laser. Cystoscopy in the office June 28th indicated trilobar BPH with intravesical growth of median lobe with significant obstruction.   ALLERGIES: No drug allergies.   CURRENT MEDICATIONS:  1. Acidophilus. 2. Omeprazole. 3. Prednisone. 4. Allopurinol. 5. Asacol. 6. Aspirin. 7. Atenolol. 8. B12. 9. Iron sulfate. 10. Finasteride. 11. Imodium. 12. Insulin. 13. Klor-Con. 14. Metformin. 15. Multivitamins.  16. Pravastatin. 17. Tamsulosin. 18. NovoLog.   PAST SURGICAL HISTORY:  1. Appendectomy.  2. Cholecystectomy. 3. Lithotripsy. 4. Coronary bypass graft. 5. Eye surgery.   PAST AND CURRENT MEDICAL CONDITIONS:  1. Coronary artery disease.  2. Hypertension.  3. Hyperlipidemia.  4. Type II diabetes. 5. Chronic obstructive pulmonary disease. 6. Gout.  7. Peptic ulcer disease.  8. Depression. 9. Pancreatitis and pancreatic cyst.  10. History of kidney stones. 11. Ulcerative colitis.   REVIEW OF SYSTEMS: The patient has chronic diarrhea. He denied chest pain or shortness of breath.   PHYSICAL EXAMINATION:   GENERAL: Elderly white male in no acute distress.   HEENT: Sclerae were clear. Pupils were equally round and reactive to light and accommodation. Extraocular movements were intact.   NECK: Supple. No palpable cervical adenopathy.   LUNGS: Clear to auscultation.   CARDIOVASCULAR: Regular rhythm and rate without audible murmurs.   ABDOMEN:  Soft, nontender abdomen.   GU: Circumcised. Testes smooth, nontender, 18 mL size each.   RECTAL: 40 gram smooth nontender prostate.   PSA was 0.7 ng on June 20th.    IMPRESSION:  1. Urinary retention.  2. Benign prostatic hypertrophy with bladder outlet obstruction.   PLAN: Photovaporization of the prostate with green light laser.   ____________________________ Otelia Limes. Yves Dill, MD mrw:drc D: 08/30/2011 09:59:36 ET T: 08/30/2011 10:22:05 ET JOB#: [id number]  cc: Otelia Limes. Yves Dill, MD, <Dictator> Royston Cowper MD ELECTRONICALLY SIGNED 08/30/2011 15:09

## 2014-05-31 NOTE — Discharge Summary (Signed)
PATIENT NAME:  Angel Franklin, Angel Franklin MR#:  [id number] DATE OF BIRTH:  1941-01-30  DISCHARGE AND TRANSFER SUMMARY  DATE OF ADMISSION:  06/20/2013 DATE OF DISCHARGE:  06/26/2013   TYPE OF DISCHARGE: The patient transferred to a skilled nursing facility.   REASON FOR ADMISSION: Hip fracture.   HISTORY OF PRESENT ILLNESS: The patient is a 74 year old male with a history of coronary artery disease, status post CABG, diabetes, hypertension, who fell after he tripped, injuring his right hip. He was brought to the Emergency Room, where he was found to have a hip fracture and was admitted for further evaluation.   PAST MEDICAL HISTORY:  1. ASCVD status post CABG.  2. COPD.  3. Gout.  4. Type 2 diabetes.  5. Hyperlipidemia.  6. Benign hypertension.  7. History of peptic ulcer disease.  8. History of pancreatitis.  9. Ulcerative colitis.  10. Nephrolithiasis.  11. Status post cholecystectomy.  12. Status post appendectomy.   MEDICATIONS ON ADMISSION: Please see admission note.   ALLERGIES: PROPOFOL.   SOCIAL HISTORY: The patient has a remote history of alcohol and tobacco abuse, but none recently.   FAMILY HISTORY: Positive for prostate cancer and coronary artery disease.   REVIEW OF SYSTEMS: As per admission note.   PHYSICAL EXAMINATION:  GENERAL: The patient was in no acute distress.  VITAL SIGNS: Stable, and he was afebrile.  HEENT: Unremarkable.  NECK: Supple without JVD.  LUNGS: Clear.  CARDIAC: Regular rate and rhythm. Normal S1, S2.  ABDOMEN: Soft and nontender.  EXTREMITIES: Without edema.  NEUROLOGIC: Grossly nonfocal.   HOSPITAL COURSE: The patient was admitted with right hip fracture. He was seen by orthopedics and underwent surgical repair of his right hip. Postoperatively, the patient continued to have weakness and difficulty ambulating. He did have some acute blood loss anemia postoperative. His sugars and cardiac status remained stable. Placement was recommended by  physical therapy. The patient agrees. The patient is now transferred to the skilled nursing facility for further evaluation and treatment.   DISCHARGE DIAGNOSES:  1. Right hip fracture, status post surgical repair.  2. Acute blood loss anemia postoperatively.  3. Atherosclerotic cardiovascular disease status post coronary artery bypass graft.  4. Type 2 diabetes.  5. Benign hypertension.  6. Hyperlipidemia.  7. Chronic obstructive pulmonary disease.  8. Gout.  9. Ulcerative colitis.   DISCHARGE MEDICATIONS:  1. Allopurinol 300 mg p.o. daily.  2. Atenolol 25 mg p.o. daily.  3. B12 1000 mcg p.o. daily.  4. Pravachol 40 mg p.o. at bedtime.  5. Aspirin 325 mg p.o. daily.  6. Losartan 50 mg p.o. b.Franklin.d.  7. Harpers low dose sliding scale insulin with Accu-Cheks q.a.c. and at bedtime.  8. Iron sulfate 325 mg p.o. b.Franklin.d.  9. Levemir 30 units subcutaneous at bedtime.  10. Oxycodone 5 mg 1 to 2 tablets every 4 hours as needed for pain.   FOLLOWUP PLANS AND APPOINTMENTS: The patient will be discharged to a skilled nursing facility and will be followed by the resident physician there. He is on a 2 gram sodium, 1800 calorie ADA diet. He will be seen in consultation by physical therapy and occupational therapy. Will obtain a CBC and a MET-B in 1 week.   ____________________________ Leonie Douglas. Doy Hutching, MD jds:lb D: 06/26/2013 07:10:56 ET T: 06/26/2013 07:27:49 ET JOB#: [id number]  cc: Leonie Douglas. Doy Hutching, MD, <Dictator> Lyanna Blystone Lennice Sites MD ELECTRONICALLY SIGNED 06/26/2013 14:54

## 2014-05-31 NOTE — Consult Note (Signed)
Brief Consult Note: Diagnosis: Displaced right femoral neck fracture.   Patient was seen by consultant.   Comments: Make NPO after midnight Will pass on to [facility] ortho in AM for surgical decision.  Electronic Signatures: Lynnda Shields (MD)  (Signed 14-May-15 19:52)  Authored: Brief Consult Note   Last Updated: 14-May-15 19:52 by Lynnda Shields (MD)

## 2014-05-31 NOTE — H&P (Signed)
PATIENT NAME:  Angel Franklin, Angel Franklin MR#:  [id number] DATE OF BIRTH:  1940/04/27  DATE OF ADMISSION:  06/20/2013  PRIMARY CARE PHYSICIAN: Leonie Douglas. Doy Hutching, MD   REFERRING PHYSICIAN: Briant Sites. Joni Fears, MD  CHIEF COMPLAINT: Right hip pain.   HISTORY OF PRESENT ILLNESS: Angel Franklin is a 74 year old pleasant male with history of hypertension, hyperlipidemia, coronary artery disease, status post coronary artery bypass grafting, diabetes mellitus, who was walking out of [employer], missed a step and fell down to the floor. He started to experience pain on the right side of the hip. Concerning this, came to the Emergency Department. Workup in the Emergency Department: The patient is found to have right subcapital femoral neck fracture. The patient was seen by orthopedic surgery; plan to do the surgery in the morning. The patient, at baseline, well functional. Denies having any chest pain, shortness of breath. Denies having any recent stress test.   PAST MEDICAL HISTORY:  1. Hypertension.  2. Hyperlipidemia.  3. Diabetes mellitus.  4. Coronary artery disease, status post coronary artery bypass grafting.  5. COPD.  6. Gout.  7. Peptic ulcer disease.  8. Depression.  9. Pancreatitis and pancreatic cyst.  10. History of kidney stones.  11. Ulcerative colitis.   PAST SURGICAL HISTORY:  1. Appendectomy.  2. Cholecystectomy.  3. Lithotripsy.  4. Coronary artery bypass grafting.  5. Eye surgery.   ALLERGIES: No known drug allergies.   HOME MEDICATIONS:  1. Vitamin B12 250 mcg once a day.  2. Pravastatin 40 mg once a day.  3. NovoLog on sliding scale.  4. Multivitamin 1 tablet once a day.  5. Metformin 500 mg 2 times a day.  6. Lantus 30 units once a day.  7. Imodium 0.2 mg 1 tablet 3 times a day.  8. Ferrous sulfate 325 mg once a day.  9. Atenolol 25 mg once a day.  10. Aspirin 81 mg once a day.  11. Asacol 800 mg 3 times a day.  12. Allopurinol 300 mg once a day.  13. Acidophilus 120 mg  once a day.   SOCIAL HISTORY: Remote history of smoking and of drinking alcohol. Currently, denies smoking or drinking alcohol. Denies using any illicit drugs.   FAMILY HISTORY: Prostate cancer, coronary artery disease.   REVIEW OF SYSTEMS:  CONSTITUTIONAL: Denies any generalized weakness.  EYES: No change in vision.  ENT: No change in hearing.  RESPIRATORY: No cough, shortness of breath.  CARDIOVASCULAR: No chest pain, palpations.  GASTROINTESTINAL: No nausea, vomiting, abdominal pain.  GENITOURINARY: No dysuria or hematuria.  HEMATOLOGIC: No easy bruising or bleeding.  SKIN: No rash or lesions.  MUSCULOSKELETAL: Has pain in the right hip.  ENDOCRINE: Has a diagnosis of diabetes mellitus.  NEUROLOGIC: No weakness or numbness in any part of the body.   PHYSICAL EXAMINATION:  GENERAL: This is a well-built, well-nourished, age-appropriate male lying down in the bed, not in distress.  VITAL SIGNS: Temperature 98.8, pulse 72, blood pressure 179/82, respiratory rate of 16, oxygen saturation is 94% on room air.  HEENT: Head normocephalic, atraumatic. Eyes: No scleral icterus. Conjunctivae normal. Pupils equal, round and reactive to light. Extraocular movements are intact. Mucous membranes moist. No pharyngeal erythema.  NECK: Supple. No lymphadenopathy. No JVD. No carotid bruit. No thyromegaly.  CHEST: Has no focal tenderness.  LUNGS: Bilaterally clear to auscultation.  HEART: S1, S2 regular. No murmurs are heard.  ABDOMEN: Obese. Bowel sounds present. Soft, nontender, nondistended. No hepatosplenomegaly.  EXTREMITIES: No pedal edema. Pulses 2+.  Right externally rotated.  SKIN: No rash or lesions.  MUSCULOSKELETAL: As mentioned above, right lower extremity is externally rotated.  NEUROLOGIC: The patient is alert, oriented to place, person and time. Cranial nerves II through XII intact. Motor 5/5 in upper and lower extremities.   LABORATORIES: UA: Mild nitrites positive, WBC of 5. Chest  x-ray, 1-view, portable: No acute cardiopulmonary disease. X-ray of the hip: Impacted subcapital femoral neck fracture. CMP is completely within normal limits. CBC: , platelet count of 141.   ASSESSMENT AND PLAN: Angel Franklin is a 74 year old who had a mechanical fall and sustained right femoral neck fracture.   1. Right femoral neck fracture. The patient is well functional at baseline. Denies any chest pain or shortness of breath with exertion. The patient is status post coronary artery bypass grafting about 15 years back. No recent stress test. Considering the patient being well functional, the patient is at moderate risk for the surgery considering the patient's underlying diabetes mellitus, hypertension and age. Recommend early ambulation, incentive spirometer at bedside to prevent atelectasis. Keep the patient on deep vein thrombosis prophylaxis.  2. Urinary tract infection. Will obtain urine cultures. Start the patient on Rocephin.  3. Diabetes mellitus. Continue with Lantus. Will hold the metformin for now.  4. Hypertension, moderately controlled, could be secondary to pain. Continue the home medications. The patient is on beta blocker. Will continue with atenolol.   5. Keep the patient on deep vein thrombosis prophylaxis with Lovenox.   TIME SPENT: 50 minutes.   ____________________________ Angel Becton, MD pv:lb D: 06/21/2013 04:18:36 ET T: 06/21/2013 06:49:08 ET JOB#: [id number]  cc: Angel Becton, MD, <Dictator> Leonie Douglas. Doy Hutching, MD Angel Becton MD ELECTRONICALLY SIGNED 07/05/2013 0:42

## 2014-05-31 NOTE — Consult Note (Signed)
PATIENT NAME:  Angel Franklin, Angel Franklin MR#:  [id number] DATE OF BIRTH:  04/30/1940  DATE OF CONSULTATION:  06/20/2013  REQUESTING PHYSICIAN:  Dr. Jimmye Norman CONSULTING PHYSICIAN:  Alysia Penna. Fatimata Talsma, MD  REASON FOR CONSULTATION:  A 74 year old male with displaced right hip fracture.   HISTORY OF PRESENT ILLNESS: The patient was leaving [employer], tripped, landed on his right hip. No other injuries. Was unable to ambulate.  Was brought to the Emergency Room. No prior hip difficulty. X-rays revealed a displaced femoral neck fracture.   PAST MEDICAL HISTORY: Notable for gout, insulin-dependent diabetes, hypertension.   ALLERGIES: PROPOFOL.   MEDICATIONS ON ADMISSION:  Asacol 800 mg t.Franklin.d., aspirin 81 mg p.o. daily, Lantus 100 units/mL and 30 units q.a.m., metformin 500 mg p.o. b.Franklin.d., NovoLog sliding scale, allopurinol 300 mg p.o. daily, pravastatin 40 mg p.o. daily, atenolol 25 mg p.o. daily, iron, acidophilus, multiple vitamin, vitamin B12, and Imodium on a daily basis.   REVIEW OF SYSTEMS: Unremarkable for any acute cardiorespiratory, GI, or GU symptoms. No fevers, chills, or constitutional symptoms.   FAMILY HISTORY: Noncontributory.   SOCIAL HISTORY: He is married. Lives with his wife. No illicit drug use.   Never a smoker. Does not drink alcohol.   PHYSICAL EXAMINATION:  GENERAL: Well-developed male. He is alert and oriented. He is cooperative throughout the examination.  VITAL SIGNS: Blood pressure is 140/80, pulse is 80 and regular, respirations are 18.  HEENT: PERRL. EOMI.  NECK: Supple without bruits.  LUNGS: Clear.  CARDIAC: Normal.  ABDOMEN: Benign.  CHEST: Nontender.  UPPER EXTREMITIES: Good range of motion with no significant tenderness. Normal neurovascular examination.  RIGHT LOWER EXTREMITY: Short and externally rotated. Pain in the groin with any attempted range of motion. Knee, foot, and ankle are unremarkable.  LEFT LOWER EXTREMITY: Hip, knee, foot and ankle have a full range of  motion. Neurovascular examination is intact.   LABORATORY DATA: Laboratory data is reviewed. MET-B is notable for glucose of 180. Hemogram reveals a hemoglobin of 14.2, white blood cell count of 11.1. Coagulation, PTT is 28.1. Urinalysis is unremarkable. There are 14 red blood cells and 5 white blood cells. Chest x-ray reveals no acute disease. Right hip films reveal a displaced femoral neck fracture.   IMPRESSION:  1. Displaced right femoral neck fracture. Will need operative treatment at the discretion of the operating surgeon tomorrow.  2. Diabetes, stable.  3. Gout, stable.  4. Hypertension, stable.   PLAN: The patient will be admitted by the medicine service for his underlying medical issues. We will keep him n.p.o. tonight. Anticipate surgical intervention tomorrow. The patient understands to call if he has significant pain.    ____________________________ Alysia Penna. Mauri Pole, MD jcc:dd D: 06/20/2013 19:51:21 ET T: 06/20/2013 20:49:11 ET JOB#: [id number]  cc: Alysia Penna. Mauri Pole, MD, <Dictator> Alysia Penna Tayte Childers MD ELECTRONICALLY SIGNED 07/24/2013 6:03

## 2014-05-31 NOTE — Op Note (Signed)
PATIENT NAME:  Angel Franklin, Angel Franklin MR#:  [id number] DATE OF BIRTH:  [birth year]  DATE OF PROCEDURE:  06/21/2013  PREOPERATIVE DIAGNOSIS: Right femoral neck fracture, displaced; and mild hip osteoarthritis.   POSTOPERATIVE DIAGNOSIS: Right femoral neck fracture, displaced; and mild hip osteoarthritis.   PROCEDURE: Right anterior total hip replacement.   ANESTHESIA: General.   SURGEON: Laurene Footman, MD  DESCRIPTION OF PROCEDURE: The patient was brought to the Operating Room, and after adequate anesthesia was obtained, the patient was placed on the operative table with the left leg on a well-padded table, right leg in the Medacta attachment. The C-arm was brought in, and good visualization of the hip could be obtained with preop template obtained, with a traction view that reduced the fracture. The hip was then prepped and draped in the usual sterile fashion. Appropriate patient identification and timeout procedures completed. A direct anterior approach was made with the incision centered over the tensor fasciae muscle and greater trochanter. The tensor muscle was incised, and the fascia was incised and the muscle retracted laterally. Deep fascia incised and the lateral femoral circumflex vessels ligated. The anterior capsule was then exposed and a capsulotomy created. The head was removed through the fracture site with mild central arthritis being noted. The acetabulum was prepared with sequential reaming to 58 mm. A 58 mm Versafit cup DM was impacted and was appropriately positioned. The neck was then resected to the appropriate level. Sequential broaching carried out after external rotation and release of the ischiofemoral ligament. The leg was dropped into extension with external rotation, and broaching to a #6 gave a very nice fill to the canal, with an S head and appropriate sized liner trial. Leg lengths appeared equal. These final components were assembled and impacted onto the head. The hip was  reduced and was stable to 90 degrees external rotation test. X-ray showed appropriate sizing. The wound was thoroughly irrigated. Then, 30 mL of 0.25% Sensorcaine with epinephrine was infiltrated in the periarticular tissues. The tensor muscle was then repaired using a heavy quill, 2-0 quill subcutaneously and skin staples. Xeroform, 4 x 4's, ABD and tape were applied. The patient was then sent to the recovery room in stable condition.   ESTIMATED BLOOD LOSS: 400 mL.   COMPLICATIONS: None.   SPECIMEN: Removed femoral head.   IMPLANTS: Medacta 6 Amis collared stem, 58 mm Versafit cup DM with liner and S 28 mm head.   ____________________________ Laurene Footman, MD mjm:lb D: 06/22/2013 08:33:45 ET T: 06/22/2013 11:23:01 ET JOB#: [id number]  cc: Laurene Footman, MD, <Dictator> Laurene Footman MD ELECTRONICALLY SIGNED 06/22/2013 12:17

## 2014-07-01 ENCOUNTER — Encounter (INDEPENDENT_AMBULATORY_CARE_PROVIDER_SITE_OTHER): Payer: Medicare Other | Admitting: Ophthalmology

## 2014-07-01 DIAGNOSIS — I10 Essential (primary) hypertension: Secondary | ICD-10-CM | POA: Diagnosis not present

## 2014-07-01 DIAGNOSIS — H35033 Hypertensive retinopathy, bilateral: Secondary | ICD-10-CM

## 2014-07-01 DIAGNOSIS — H34811 Central retinal vein occlusion, right eye: Secondary | ICD-10-CM | POA: Diagnosis not present

## 2014-08-05 ENCOUNTER — Encounter (INDEPENDENT_AMBULATORY_CARE_PROVIDER_SITE_OTHER): Payer: Medicare Other | Admitting: Ophthalmology

## 2014-08-05 DIAGNOSIS — H43813 Vitreous degeneration, bilateral: Secondary | ICD-10-CM

## 2014-08-05 DIAGNOSIS — E11319 Type 2 diabetes mellitus with unspecified diabetic retinopathy without macular edema: Secondary | ICD-10-CM | POA: Diagnosis not present

## 2014-08-05 DIAGNOSIS — H34811 Central retinal vein occlusion, right eye: Secondary | ICD-10-CM | POA: Diagnosis not present

## 2014-08-05 DIAGNOSIS — H35033 Hypertensive retinopathy, bilateral: Secondary | ICD-10-CM

## 2014-08-05 DIAGNOSIS — I10 Essential (primary) hypertension: Secondary | ICD-10-CM | POA: Diagnosis not present

## 2014-08-05 DIAGNOSIS — E11329 Type 2 diabetes mellitus with mild nonproliferative diabetic retinopathy without macular edema: Secondary | ICD-10-CM | POA: Diagnosis not present

## 2014-08-05 DIAGNOSIS — D3132 Benign neoplasm of left choroid: Secondary | ICD-10-CM

## 2014-08-21 ENCOUNTER — Other Ambulatory Visit: Payer: Self-pay | Admitting: Unknown Physician Specialty

## 2014-08-21 ENCOUNTER — Ambulatory Visit
Admission: RE | Admit: 2014-08-21 | Discharge: 2014-08-21 | Disposition: A | Discharge status: Home / Self Care | Payer: Medicare Other | Source: Ambulatory Visit | Attending: Unknown Physician Specialty | Admitting: Unknown Physician Specialty

## 2014-08-21 DIAGNOSIS — R1030 Lower abdominal pain, unspecified: Secondary | ICD-10-CM

## 2014-08-21 DIAGNOSIS — R1031 Right lower quadrant pain: Secondary | ICD-10-CM | POA: Insufficient documentation

## 2014-08-21 DIAGNOSIS — N2889 Other specified disorders of kidney and ureter: Secondary | ICD-10-CM | POA: Insufficient documentation

## 2014-08-21 DIAGNOSIS — R1032 Left lower quadrant pain: Secondary | ICD-10-CM | POA: Diagnosis present

## 2014-08-21 HISTORY — DX: Essential (primary) hypertension: I10

## 2014-08-21 HISTORY — DX: Type 2 diabetes mellitus without complications: E11.9

## 2014-08-21 MED ORDER — IOHEXOL 300 MG/ML  SOLN
100.0000 mL | Freq: Once | INTRAMUSCULAR | Status: AC | PRN
  Administered 2014-08-21: 100 mL via INTRAVENOUS

## 2014-08-22 ENCOUNTER — Inpatient Hospital Stay: Payer: Medicare Other | Attending: Internal Medicine | Admitting: Internal Medicine

## 2014-08-22 ENCOUNTER — Encounter: Payer: Self-pay | Admitting: Internal Medicine

## 2014-08-22 ENCOUNTER — Inpatient Hospital Stay: Payer: Medicare Other

## 2014-08-22 VITALS — BP 175/96 | HR 81 | Temp 97.4°F | Resp 18 | Ht 74.0 in | Wt 194.7 lb

## 2014-08-22 DIAGNOSIS — Z79899 Other long term (current) drug therapy: Secondary | ICD-10-CM | POA: Diagnosis not present

## 2014-08-22 DIAGNOSIS — I10 Essential (primary) hypertension: Secondary | ICD-10-CM | POA: Insufficient documentation

## 2014-08-22 DIAGNOSIS — Z8639 Personal history of other endocrine, nutritional and metabolic disease: Secondary | ICD-10-CM | POA: Insufficient documentation

## 2014-08-22 DIAGNOSIS — Z794 Long term (current) use of insulin: Secondary | ICD-10-CM | POA: Diagnosis not present

## 2014-08-22 DIAGNOSIS — E119 Type 2 diabetes mellitus without complications: Secondary | ICD-10-CM | POA: Insufficient documentation

## 2014-08-22 DIAGNOSIS — M545 Low back pain, unspecified: Secondary | ICD-10-CM

## 2014-08-22 DIAGNOSIS — Z7982 Long term (current) use of aspirin: Secondary | ICD-10-CM | POA: Insufficient documentation

## 2014-08-22 DIAGNOSIS — K769 Liver disease, unspecified: Secondary | ICD-10-CM | POA: Diagnosis not present

## 2014-08-22 DIAGNOSIS — K5190 Ulcerative colitis, unspecified, without complications: Secondary | ICD-10-CM | POA: Diagnosis not present

## 2014-08-22 DIAGNOSIS — I81 Portal vein thrombosis: Secondary | ICD-10-CM | POA: Insufficient documentation

## 2014-08-22 DIAGNOSIS — R160 Hepatomegaly, not elsewhere classified: Secondary | ICD-10-CM

## 2014-08-22 DIAGNOSIS — Z8711 Personal history of peptic ulcer disease: Secondary | ICD-10-CM | POA: Insufficient documentation

## 2014-08-22 DIAGNOSIS — Z87891 Personal history of nicotine dependence: Secondary | ICD-10-CM | POA: Insufficient documentation

## 2014-08-22 DIAGNOSIS — N400 Enlarged prostate without lower urinary tract symptoms: Secondary | ICD-10-CM | POA: Diagnosis not present

## 2014-08-22 DIAGNOSIS — E785 Hyperlipidemia, unspecified: Secondary | ICD-10-CM | POA: Insufficient documentation

## 2014-08-22 DIAGNOSIS — K869 Disease of pancreas, unspecified: Secondary | ICD-10-CM | POA: Insufficient documentation

## 2014-08-22 DIAGNOSIS — R109 Unspecified abdominal pain: Secondary | ICD-10-CM | POA: Insufficient documentation

## 2014-08-22 DIAGNOSIS — K8689 Other specified diseases of pancreas: Secondary | ICD-10-CM

## 2014-08-22 LAB — CBC WITH DIFFERENTIAL/PLATELET
Basophils Absolute: 0 10*3/uL (ref 0–0.1)
Basophils Relative: 0 %
Eosinophils Absolute: 0.1 10*3/uL (ref 0–0.7)
Eosinophils Relative: 1 %
HCT: 43.2 % (ref 40.0–52.0)
HEMOGLOBIN: 14 g/dL (ref 13.0–18.0)
Lymphocytes Relative: 19 %
Lymphs Abs: 1.3 10*3/uL (ref 1.0–3.6)
MCH: 28.5 pg (ref 26.0–34.0)
MCHC: 32.3 g/dL (ref 32.0–36.0)
MCV: 88.2 fL (ref 80.0–100.0)
Monocytes Absolute: 0.4 10*3/uL (ref 0.2–1.0)
Monocytes Relative: 6 %
NEUTROS ABS: 5.1 10*3/uL (ref 1.4–6.5)
NEUTROS PCT: 74 %
Platelets: 95 10*3/uL — ABNORMAL LOW (ref 150–440)
RBC: 4.9 MIL/uL (ref 4.40–5.90)
RDW: 13.5 % (ref 11.5–14.5)
WBC: 6.9 10*3/uL (ref 3.8–10.6)

## 2014-08-22 LAB — BASIC METABOLIC PANEL
Anion gap: 12 (ref 5–15)
BUN: 17 mg/dL (ref 6–20)
CALCIUM: 8.7 mg/dL — AB (ref 8.9–10.3)
CO2: 25 mmol/L (ref 22–32)
Chloride: 101 mmol/L (ref 101–111)
Creatinine, Ser: 1.2 mg/dL (ref 0.61–1.24)
GFR, EST NON AFRICAN AMERICAN: 57 mL/min — AB (ref >60)
GLUCOSE: 169 mg/dL — AB (ref 65–99)
Potassium: 3.6 mmol/L (ref 3.5–5.1)
Sodium: 138 mmol/L (ref 135–145)

## 2014-08-22 LAB — HEPATIC FUNCTION PANEL
ALBUMIN: 3.9 g/dL (ref 3.5–5.0)
ALK PHOS: 153 U/L — AB (ref 38–126)
ALT: 86 U/L — AB (ref 17–63)
AST: 65 U/L — ABNORMAL HIGH (ref 15–41)
BILIRUBIN DIRECT: 0.2 mg/dL (ref 0.1–0.5)
Indirect Bilirubin: 0.9 mg/dL (ref 0.3–0.9)
Total Bilirubin: 1.1 mg/dL (ref 0.3–1.2)
Total Protein: 7.3 g/dL (ref 6.5–8.1)

## 2014-08-22 LAB — VITAMIN B12: Vitamin B-12: 3285 pg/mL — ABNORMAL HIGH (ref 180–914)

## 2014-08-22 LAB — IRON AND TIBC
IRON: 24 ug/dL — AB (ref 45–182)
SATURATION RATIOS: 8 % — AB (ref 17.9–39.5)
TIBC: 288 ug/dL (ref 250–450)
UIBC: 264 ug/dL

## 2014-08-22 LAB — PROTIME-INR
INR: 1.13
PROTHROMBIN TIME: 14.7 s (ref 11.4–15.0)

## 2014-08-22 LAB — FERRITIN: Ferritin: 91 ng/mL (ref 24–336)

## 2014-08-22 LAB — APTT: APTT: 31 s (ref 24–36)

## 2014-08-22 MED ORDER — TRAMADOL HCL 50 MG PO TABS: 50.0000 mg | ORAL_TABLET | Freq: Four times a day (QID) | ORAL | Status: DC | PRN

## 2014-08-22 NOTE — Progress Notes (Signed)
Pt here due to pain in lower back and abnl scan on ct showing pancreas mass, abnl in liver, and kidney mass. Has no appetite. Has loose stools due to ulcerative colitis and takes imodium for it.

## 2014-08-25 LAB — PROTEIN ELECTROPHORESIS, SERUM
A/G Ratio: 1.1 (ref 0.7–1.7)
ALPHA-1-GLOBULIN: 0.3 g/dL (ref 0.0–0.4)
Albumin ELP: 3.4 g/dL (ref 2.9–4.4)
Alpha-2-Globulin: 0.8 g/dL (ref 0.4–1.0)
Beta Globulin: 1.1 g/dL (ref 0.7–1.3)
Gamma Globulin: 0.8 g/dL (ref 0.4–1.8)
Globulin, Total: 3.1 g/dL (ref 2.2–3.9)
TOTAL PROTEIN ELP: 6.5 g/dL (ref 6.0–8.5)

## 2014-08-25 NOTE — Progress Notes (Signed)
Angel Franklin  [phone] [phone]) [phone]     ID: Angel Franklin OB: 05-21-1940  MR#: [id number]  JYN#:[id number]  Patient Care Team: Idelle Crouch, MD as PCP - General (Unknown Physician Specialty)  CHIEF COMPLAINT/DIAGNOSIS:  Progressive low back pain, intermittent abdominal pain. CT scan of the abdomen/pelvis with contrast on 08/21/2014 reports enlarging cystic mass in the head of the pancreas and uncinate process, diffuse abnormality in the left lobe of the liver with extension into the main portal vein and right and left portal veins within the liver raising suspicion for neoplasm was suspected metastatic disease. Peripherally enhancing mass lesion in the anterior aspect of the left kidney measuring 17 mm.  -  Patient referred here for Oncology evaluation and management.  HISTORY OF PRESENT ILLNESS:  Angel Franklin is a 74 year old gentleman with past medical history as described below. Patient has a lower abdominal pain for about 3 weeks now. He also has low back pain for a few weeks which is getting worse. Patient has ulcerative colitis and usually has 1-3 soft stools per day and takes Lomotil which keeps this under control. States that he had right hip replacement done May 2015 and recovered well from it. Low back pain is constant, denies any radiation of pain to both extremities. Denies any focal muscle weakness, denies urinary or fecal incontinence or retention. States that appetite has been poor for the last few weeks, unsure if it is due to pain issues. He has had some weight loss but unable to quantify. Denies any known history of malignancy in the past. No nausea or vomiting. No bright red blood in stools, melena or hematuria.Marland Kitchen  REVIEW OF SYSTEMS:   ROS CONSTITUTIONAL: As in HPI above. No chills, fever or sweats.    ENT:  No headache, dizziness or epistaxis. No ear or jaw pain. No sinus symptoms. RESPIRATORY:   No cough.  No shortness of breath. No  wheezing. No hemoptysis. CARDIAC:  No palpitations.  No retrosternal chest pain. No orthopnea, PND. GI:  Lower abdominal pain, nausea or vomiting. No diarrhea.   GU:  No dysuria or hematuria.  SKIN: No rashes or pruritus. HEMATOLOGIC: denies bleeding symptoms MUSCULOSKELETAL: progressive back pain as described above. Denies other new bone pains.  EXTREMITY:  No new swelling or pain.  NEURO:  No focal weakness. No numbness or tingling of extremities.  No seizures.   ENDOCRINE:  No polyuria or polydipsia.   PS ECOG 1  PAST MEDICAL HISTORY: Reviewed. Past Medical History  Diagnosis Date  . Diabetes mellitus without complication   . Hypertension   . Hyperlipidemia   . Gout   . BPH (benign prostatic hypertrophy)   . PUD (peptic ulcer disease)   . Pancreatitis, chronic   . Ulcerative colitis     PAST SURGICAL HISTORY: Reviewed. Past Surgical History  Procedure Laterality Date  . Appendectomy    . Coronary artery bypass graft      1995  . Total hip arthroplasty Right 06/21/13  . Cholecystectomy      FAMILY HISTORY: Reviewed. Father had prostate cancer , denies other maligancy.  SOCIAL HISTORY: Reviewed. History  Substance Use Topics  . Smoking status: Former Smoker    Quit date: 06/07/1993  . Smokeless tobacco: Former Systems developer    Quit date: 06/07/1993  . Alcohol Use: No     Comment: quit drinking in Nov 1975    Allergies  Allergen Reactions  . Propofol Other (See Comments)    Makes  him very sick.    Current Outpatient Prescriptions  Medication Sig Dispense Refill  . acetaminophen (TYLENOL) 500 MG tablet Take 1,000 mg by mouth every 6 (six) hours as needed.    Marland Kitchen allopurinol (ZYLOPRIM) 300 MG tablet Take 300 mg by mouth daily.    Marland Kitchen aspirin EC 81 MG tablet Take 81 mg by mouth daily.    Marland Kitchen atenolol (TENORMIN) 25 MG tablet Take by mouth daily.    . ferrous sulfate 325 (65 FE) MG tablet Take 325 mg by mouth daily with breakfast.    . insulin aspart (NOVOLOG) 100 UNIT/ML  injection Inject 10 Units into the skin 3 (three) times daily before meals.    . insulin glargine (LANTUS) 100 UNIT/ML injection Inject 30 Units into the skin at bedtime.    . Loperamide-Simethicone 2-125 MG TABS Take 1 Dose by mouth 3 (three) times daily as needed.    . mesalamine (ASACOL) 400 MG EC tablet Take 400 mg by mouth 3 (three) times daily. 2 tablets by mouth three times a day    . Multiple Vitamin (MULTIVITAMIN) tablet Take 1 tablet by mouth daily.    . pravastatin (PRAVACHOL) 40 MG tablet Take 40 mg by mouth daily.    . vitamin B-12 (CYANOCOBALAMIN) 250 MCG tablet Take 250 mcg by mouth daily.    . traMADol (ULTRAM) 50 MG tablet Take 1 tablet (50 mg total) by mouth every 6 (six) hours as needed. 60 tablet 0   No current facility-administered medications for this visit.    PHYSICAL EXAM: Filed Vitals:   08/22/14 1123  BP: 175/96  Pulse: 81  Temp: 97.4 F (36.3 C)  Resp: 18     Body mass index is 24.98 kg/(m^2).    ECOG FS:1 - Symptomatic but completely ambulatory  GENERAL: Patient is alert and oriented and in no acute distress. There is no icterus. HEENT: EOMs intact. Oral exam negative for thrush or lesions. No cervical lymphadenopathy. CVS: S1S2, regular LUNGS: Bilaterally clear to auscultation, no rhonchi. ABDOMEN: Soft, nontender. No hepatosplenomegaly clinically.  NEURO: grossly nonfocal, cranial nerves are intact.  EXTREMITIES: No pedal edema. LYMPHATICS: No palpable adenopathy in axillary or inguinal areas. SKIN:  No major bruising or rash MUSCULOSKELETaL:  No obvious joint redness and swelling   LAB RESULTS: 08/20/2014 - hemoglobin 13.5, WBC 7-100, platelets 115, ANC 4870, MCV 91.1.  STUDIES: Ct Abdomen Pelvis W Contrast  08/21/2014   CLINICAL DATA:  Lower abdominal pain for 2 weeks  EXAM: CT ABDOMEN AND PELVIS WITH CONTRAST  TECHNIQUE: Multidetector CT imaging of the abdomen and pelvis was performed using the standard protocol following bolus administration  of intravenous contrast.  CONTRAST:  118mL OMNIPAQUE IOHEXOL 300 MG/ML  SOLN  COMPARISON:  12/25/2008  FINDINGS: Lung bases are free of acute infiltrate or sizable effusion.  The gallbladder is been surgically removed. There is heterogeneity identified in the medial segment of the left lobe of the liver with extension inferiorly growing into the portal vein consistent with tumor thrombus. The tumor thrombus measures approximately 4.7 cm in greatest craniocaudad projection and appears to extend into both the left and right portal veins with bland appearing thrombus within the right portal vein as well as suggestion of bland thrombus within the main portal vein proximally. An large recanalized paraumbilical vein is noted although its enhancement pattern is decreased when compared with the prior CT examination which could be related to tumor thrombus as well. Hypertrophy of the caudate lobe is noted.  The spleen  is mildly prominent but stable from previous exam. The adrenal glands and pancreas are well visualized. There again noted cystic changes within the pancreas. Many of the cystic areas are stable from previous exam however in the head of the pancreas the dominant area now measures 2.6 cm. This could represent a cystic neoplasm within pancreas. Some calcifications are noted as well. Kidneys are well visualized bilaterally without renal calculi or obstructive changes. In the midportion of the left left kidney anteriorly there is a peripherally enhancing mass lesion measuring approximately 17 mm. This is best seen on image number 38 of series 2 and is confirmed on delayed imaging is best seen on image number 16 of series 7. This must be viewed with suspicion for an underlying renal cell carcinoma.  The appendix is not well visualized although no inflammatory changes are seen. There is some engorgement within the mesenteric venous structures consistent with the centrally predominately obstructing lesion within the  main portal vein. The prostate is enlarged indenting upon the inferior aspect of the bladder. Postsurgical changes in the right hip are noted. No other acute bony abnormality is seen.  IMPRESSION: Diffuse heterogeneity within the medial segment of the left lobe of the liver with extension into the main portal vein as well as the right and left portal veins within the liver. Although this may represent a primary hepatic neoplasm the possibility of metastatic disease would deserve consideration as well. There also appears to be bland thrombus within the right portal venous branches as well as the main portal vein proximal to the nearly obstructing lesion at the bifurcation of the portal vein.  Enlarging cystic areas within the pancreas particularly in the head and uncinate process. The possibility of a cystic neoplasm would deserve consideration and the changes in the liver may represent metastatic disease.  Peripherally enhancing mass lesion within the anterior aspect of the left kidney as described. Suspicion for underlying neoplasm is present.  MRI of the abdomen with and without contrast material is recommended for further evaluation of these abnormalities listed above.  These results were called by telephone at the time of interpretation on 08/21/2014 at 3:06 pm to Dr. Sherilyn Cooter , who verbally acknowledged these results.   Electronically Signed   By: Inez Catalina M.D.   On: 08/21/2014 15:19     ASSESSMENT / PLAN:   Progressive low back pain, intermittent abdominal pain. CT scan of the abdomen/pelvis with contrast on 08/21/2014 reports enlarging cystic mass in the head of the pancreas and uncinate process, diffuse abnormality in the left lobe of the liver with extension into the main portal vein and right and left portal veins within the liver raising suspicion for neoplasm was suspected metastatic disease. Peripherally enhancing mass lesion in the anterior aspect of the left kidney measuring 17 mm. -  Patient  referred here for Oncology evaluation and management. Reviewed records from that physician, have independently reviewed recent CT scan of the abdomen and pelvis and discussed with patient and family present. He does have left kidney lesion which needs further evaluation. He has had cystic abnormality of the pancreas on prior CT scan done a few years ago but have explained that liver abnormality seems to be new since last CT scan. Given this along with abdominal pain, he does need further evaluation to rule out possibility of either primary liver cancer vs metastatic malignancy. Per discussion with the radiologist, they want further confirmation with MRI of the abdomen to see if this does appear to  be highly suspicious for malignancy versus other benign etiology prior to putting him through biopsy. Given his progressive low back pain and possibility of malignancy, will also pursue MRI of the lumbar spine when he goes for MRI of the abdomen. Will draw labs today including CBC, metabolic panel, baseline PT and PTT, tumor markers including serum AFP, CEA, CA-19-9. Patient has ulcerative colitis and already follows with gastroenterology. He was prescribed tramadol 50 mg every 6 hours when necessary for pain and advised about possible side effects including constipation and to avoid operating motor vehicle or machinery while on medication. If MRI abdomen is also suspicious for malignancy, will do need to pursue a liver biopsy, otherwise may need to consider EUS/biopsy of pancreatic lesion. Will see him back after the MRI is done and make plan of management.     In between visits, the patient has been advised to call or come to the ER in case of worsening pain, acute sickness, or new symptoms. Patient is agreeable to this plan.    Leia Alf, MD   08/25/2014 1:40 PM

## 2014-08-26 ENCOUNTER — Ambulatory Visit
Admission: RE | Admit: 2014-08-26 | Discharge: 2014-08-26 | Disposition: A | Discharge status: Home / Self Care | Payer: Medicare Other | Source: Ambulatory Visit | Attending: Internal Medicine | Admitting: Internal Medicine

## 2014-08-26 ENCOUNTER — Other Ambulatory Visit: Payer: Medicare Other | Admitting: *Deleted

## 2014-08-26 ENCOUNTER — Ambulatory Visit: Payer: Medicare Other | Admitting: Internal Medicine

## 2014-08-26 DIAGNOSIS — I81 Portal vein thrombosis: Secondary | ICD-10-CM | POA: Diagnosis not present

## 2014-08-26 DIAGNOSIS — M47896 Other spondylosis, lumbar region: Secondary | ICD-10-CM | POA: Insufficient documentation

## 2014-08-26 DIAGNOSIS — K8689 Other specified diseases of pancreas: Secondary | ICD-10-CM

## 2014-08-26 DIAGNOSIS — K869 Disease of pancreas, unspecified: Secondary | ICD-10-CM | POA: Diagnosis present

## 2014-08-26 DIAGNOSIS — R160 Hepatomegaly, not elsewhere classified: Secondary | ICD-10-CM

## 2014-08-26 DIAGNOSIS — M545 Low back pain, unspecified: Secondary | ICD-10-CM

## 2014-08-26 DIAGNOSIS — N289 Disorder of kidney and ureter, unspecified: Secondary | ICD-10-CM | POA: Insufficient documentation

## 2014-08-26 DIAGNOSIS — R161 Splenomegaly, not elsewhere classified: Secondary | ICD-10-CM | POA: Diagnosis not present

## 2014-08-26 DIAGNOSIS — K769 Liver disease, unspecified: Secondary | ICD-10-CM | POA: Diagnosis not present

## 2014-08-26 DIAGNOSIS — M4806 Spinal stenosis, lumbar region: Secondary | ICD-10-CM | POA: Diagnosis not present

## 2014-08-26 LAB — FOLATE RBC
Folate, Hemolysate: >620 ng/mL
Hematocrit: 42 % (ref 37.5–51.0)

## 2014-08-26 LAB — AFP TUMOR MARKER: AFP-Tumor Marker: 4.6 ng/mL (ref 0.0–8.3)

## 2014-08-26 LAB — CEA: CEA: 2.7 ng/mL (ref 0.0–4.7)

## 2014-08-26 LAB — CANCER ANTIGEN 19-9: CA 19-9: 48 U/mL — ABNORMAL HIGH (ref 0–35)

## 2014-08-26 MED ORDER — GADOBENATE DIMEGLUMINE 529 MG/ML IV SOLN
20.0000 mL | Freq: Once | INTRAVENOUS | Status: AC | PRN
  Administered 2014-08-26: 18 mL via INTRAVENOUS

## 2014-08-27 ENCOUNTER — Inpatient Hospital Stay (HOSPITAL_BASED_OUTPATIENT_CLINIC_OR_DEPARTMENT_OTHER): Payer: Medicare Other | Admitting: Internal Medicine

## 2014-08-27 VITALS — BP 175/81 | HR 56 | Temp 95.0°F | Resp 16 | Ht 74.0 in | Wt 196.0 lb

## 2014-08-27 DIAGNOSIS — Z79899 Other long term (current) drug therapy: Secondary | ICD-10-CM

## 2014-08-27 DIAGNOSIS — K769 Liver disease, unspecified: Secondary | ICD-10-CM

## 2014-08-27 DIAGNOSIS — Z7982 Long term (current) use of aspirin: Secondary | ICD-10-CM

## 2014-08-27 DIAGNOSIS — I10 Essential (primary) hypertension: Secondary | ICD-10-CM

## 2014-08-27 DIAGNOSIS — R160 Hepatomegaly, not elsewhere classified: Secondary | ICD-10-CM

## 2014-08-27 DIAGNOSIS — Z794 Long term (current) use of insulin: Secondary | ICD-10-CM | POA: Diagnosis not present

## 2014-08-27 DIAGNOSIS — Z8639 Personal history of other endocrine, nutritional and metabolic disease: Secondary | ICD-10-CM

## 2014-08-27 DIAGNOSIS — I81 Portal vein thrombosis: Secondary | ICD-10-CM

## 2014-08-27 DIAGNOSIS — M545 Low back pain: Secondary | ICD-10-CM

## 2014-08-27 DIAGNOSIS — N2889 Other specified disorders of kidney and ureter: Secondary | ICD-10-CM

## 2014-08-27 DIAGNOSIS — E785 Hyperlipidemia, unspecified: Secondary | ICD-10-CM

## 2014-08-27 DIAGNOSIS — E119 Type 2 diabetes mellitus without complications: Secondary | ICD-10-CM

## 2014-08-27 DIAGNOSIS — N400 Enlarged prostate without lower urinary tract symptoms: Secondary | ICD-10-CM

## 2014-08-27 DIAGNOSIS — R109 Unspecified abdominal pain: Secondary | ICD-10-CM

## 2014-08-27 DIAGNOSIS — K5190 Ulcerative colitis, unspecified, without complications: Secondary | ICD-10-CM

## 2014-08-27 DIAGNOSIS — D509 Iron deficiency anemia, unspecified: Secondary | ICD-10-CM

## 2014-08-27 DIAGNOSIS — Z87891 Personal history of nicotine dependence: Secondary | ICD-10-CM

## 2014-08-27 DIAGNOSIS — K869 Disease of pancreas, unspecified: Secondary | ICD-10-CM

## 2014-08-27 DIAGNOSIS — Z8711 Personal history of peptic ulcer disease: Secondary | ICD-10-CM

## 2014-08-27 NOTE — Progress Notes (Signed)
[place]  [phone] [phone]) [phone]     ID: Angel Franklin OB: May 06, 1940  MR#: [id number]  LNL#:[id number]  Patient Care Team: Idelle Crouch, MD as PCP - General (Unknown Physician Specialty)  CHIEF COMPLAINT/DIAGNOSIS:  1. Infiltrative large liver lesion, left lobe seen on both CT scan and MRI of the liver suspicious for primary liver malignancy versus      metastatic disease. CT scan also shows tumor thrombus extending into portal vein.  08/22/2014 - serum CEA (2.7) and AFP (4.6) are normal. CA-19-9 minimally elevated at 48.0. 2. Left kidney mass measuring 17 mm on CT scan. MRI abdomen also reports suspicion for kidney cancer.  3. Cystic abnormality in pancreas  - ? Sequelae of chronic pancreatitis versus other etiology. 4. Iron deficiency diagnosed on labs of 08/22/2014, hemoglobin normal at 14.0   -  Patient undergoing oncology workup  HISTORY OF PRESENT ILLNESS:  Patient returns for continued oncology follow-up. States that low back pain is much better after starting taking tramadol when necessary, denies any new side effects from this. Eating study. Remains physically active. Denies any known history of malignancy in the past. No nausea or vomiting. No bright red blood in stools, melena or hematuria.Marland Kitchen  REVIEW OF SYSTEMS:   ROS As in HPI above. In addition, no fever, chills or sweats. No new headaches or focal weakness.  No sore throat, cough, shortness of breath, sputum, hemoptysis or chest pain. No dizziness or palpitation. No dysuria or hematuria. No new skin rash or bleeding symptoms. No new paresthesias in extremities.  PS ECOG 1  PAST MEDICAL HISTORY: Reviewed. Past Medical History  Diagnosis Date  . Diabetes mellitus without complication   . Hypertension   . Hyperlipidemia   . Gout   . BPH (benign prostatic hypertrophy)   . PUD (peptic ulcer disease)   . Pancreatitis, chronic   . Ulcerative colitis     PAST SURGICAL HISTORY:  Reviewed. Past Surgical History  Procedure Laterality Date  . Appendectomy    . Coronary artery bypass graft      1995  . Total hip arthroplasty Right 06/21/13  . Cholecystectomy      FAMILY HISTORY: Reviewed. Father had prostate cancer , denies other maligancy.  SOCIAL HISTORY: Reviewed. History  Substance Use Topics  . Smoking status: Former Smoker    Quit date: 06/07/1993  . Smokeless tobacco: Former Systems developer    Quit date: 06/07/1993  . Alcohol Use: No     Comment: quit drinking in Nov 1975    Allergies  Allergen Reactions  . Propofol Other (See Comments)    Makes him very sick.    Current Outpatient Prescriptions  Medication Sig Dispense Refill  . acetaminophen (TYLENOL) 500 MG tablet Take 1,000 mg by mouth every 6 (six) hours as needed.    Marland Kitchen allopurinol (ZYLOPRIM) 300 MG tablet Take 300 mg by mouth daily.    Marland Kitchen aspirin EC 81 MG tablet Take 81 mg by mouth daily.    Marland Kitchen atenolol (TENORMIN) 25 MG tablet Take by mouth daily.    . ferrous sulfate 325 (65 FE) MG tablet Take 325 mg by mouth daily with breakfast.    . insulin aspart (NOVOLOG) 100 UNIT/ML injection Inject 10 Units into the skin 3 (three) times daily before meals.    . insulin glargine (LANTUS) 100 UNIT/ML injection Inject 30 Units into the skin at bedtime.    . Loperamide-Simethicone 2-125 MG TABS Take 1 Dose by mouth 3 (three)  times daily as needed.    . mesalamine (ASACOL) 400 MG EC tablet Take 400 mg by mouth 3 (three) times daily. 2 tablets by mouth three times a day    . Multiple Vitamin (MULTIVITAMIN) tablet Take 1 tablet by mouth daily.    . pravastatin (PRAVACHOL) 40 MG tablet Take 40 mg by mouth daily.    . traMADol (ULTRAM) 50 MG tablet Take 1 tablet (50 mg total) by mouth every 6 (six) hours as needed. 60 tablet 0  . vitamin B-12 (CYANOCOBALAMIN) 250 MCG tablet Take 250 mcg by mouth daily.     No current facility-administered medications for this visit.    PHYSICAL EXAM: Filed Vitals:   08/27/14 1147   BP: 175/81  Pulse: 56  Temp: 95 F (35 C)  Resp: 16     Body mass index is 25.15 kg/(m^2).    ECOG FS:1 - Symptomatic but completely ambulatory  GENERAL: Patient is alert and oriented and in no acute distress. There is no icterus. HEENT: EOMs intact. No cervical lymphadenopathy. CVS: S1S2, regular LUNGS: Bilaterally clear to auscultation, no rhonchi. ABDOMEN: Soft, nontender. No hepatosplenomegaly clinically.  EXTREMITIES: No pedal edema.  LAB RESULTS: 08/20/2014 - hemoglobin 13.5, WBC 7-100, platelets 115, ANC 4870, MCV 91.1.     08/22/2014 - serum CEA (2.7) and AFP (4.6) are normal. CA-19-9 minimally elevated at 48.0.  STUDIES: Mr Lumbar Spine W Wo Contrast  08/26/2014   CLINICAL DATA:  74 year old male with lumbar back pain for 3 weeks. Known pancreatic and liver lesions. Subsequent encounter.  EXAM: MRI LUMBAR SPINE WITHOUT AND WITH CONTRAST  TECHNIQUE: Multiplanar and multiecho pulse sequences of the lumbar spine were obtained without and with intravenous contrast.  CONTRAST:  18 mL MultiHance, in conjunction with contrast enhanced imaging of the abdomen reported separately.  COMPARISON:  CT Abdomen and Pelvis 08/21/2014, 12/25/2008  FINDINGS: Normal lumbar segmentation demonstrated by CT. Stable vertebral height and alignment, with mild retrolisthesis at L5-S1. Lumbar, visible lower thoracic, and visible sacral bone marrow signal is within normal limits. No marrow edema or evidence of acute osseous abnormality.  Visualized lower thoracic spinal cord is normal with conus medularis at T12-L1. Normal cauda equina nerve roots. No abnormal intradural enhancement.  Stable visualized kidneys, abdominal aorta, and IVC. Other abdominal viscera today are reported with the abdomen. Negative visualized posterior paraspinal soft tissues.  No lumbar spinal stenosis. There is mild lumbar disc degeneration from L2-L3 to L5-S1. There is a posterior annular fissure at L3-L4. There is superimposed up to  moderate lower lumbar facet degeneration, maximal at L4-L5. There is borderline to mild lateral recess stenosis at L3-L4 and L4-L5. There is mild to moderate right L3 foraminal stenosis at L3-L4 (series 2, image 5).  IMPRESSION: 1. No metastatic disease or acute osseous abnormality identified in the lumbar spine. 2. Abdomen MRI from today reported separately. 3. Mild for age lumbar spine degeneration. Mild to moderate right L3 foraminal stenosis, and mild lateral recess stenosis at L3-L4 and L4-L5.   Electronically Signed   By: Genevie Ann M.D.   On: 08/26/2014 18:13   Mr Abdomen W Wo Contrast  08/27/2014   CLINICAL DATA:  Abnormal left lobe of liver on prior CT. Pancreatic cystic lesions. Diabetes. Hypertension. No primary malignancy history submitted.  EXAM: MRI ABDOMEN WITHOUT AND WITH CONTRAST  TECHNIQUE: Multiplanar multisequence MR imaging of the abdomen was performed both before and after the administration of intravenous contrast.  CONTRAST:  43mL MULTIHANCE GADOBENATE DIMEGLUMINE 529 MG/ML IV  SOLN  COMPARISON:  CT of 08/21/2014 and 12/25/2008  FINDINGS: Lower chest: Normal heart size without pericardial or pleural effusion.  Hepatobiliary: Mild caudate lobe enlargement. Enhancing thrombus within an enlarged portal vein, and left greater than right branches. This begins in the porta hepatis, including on image 58 of series 18. Within the majority of the left lobe of the liver, heterogeneous delayed post-contrast enhancement is suspicious for infiltrative tumor. Example 6.3 x 8.2 cm on image 30 of series 17 and 5.8 x 7.1 cm on image 70 of series 18. No well-defined T2 abnormality seen. No right-sided mass identified. Status post cholecystectomy. Mild left sided intrahepatic ductal dilatation, most apparent in the lateral segment on image 12 of series 9.  The common duct is normal in caliber, without evidence of choledocholithiasis or obstructive mass.  Pancreas: Borderline main pancreatic duct dilatation with  multiple cystic foci throughout the pancreas. The largest is within the head/uncinate process and measures 1.7 cm. No dominant or highly suspicious pancreatic mass seen. No abnormal enhancement within the cystic foci.  Spleen: Borderline splenomegaly, 14.2 cm craniocaudal. A hypoenhancing focus within the medial spleen measures 8 mm and is of doubtful clinical significance.  Adrenals/Urinary Tract: Normal adrenal glands.  Normal right kidney.  anterior interpolar left renal lesion measures 1.3 cm on image 24 of series 3. Demonstrates post-contrast enhancement, including on image 82 of series 16. No hydronephrosis.  Stomach/Bowel: Normal stomach and abdominal bowel loops.  Vascular/Lymphatic: Normal caliber of the aorta and branch vessels. Multiple right renal arteries. No splenic artery aneurysm. Recannulized paraumbilical vein. Portal vein thrombosis detailed above. Splenic vein and superior mesenteric vein both patent. No retroperitoneal or retrocrural adenopathy.  Other: No ascites.  Musculoskeletal: No acute osseous abnormality.  IMPRESSION: 1. 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 2. Heterogeneous delayed enhancement within the left lobe of the liver. Concurrent mild caudate lobe enlargement. Findings are suspicious for infiltrative hepatocellular carcinoma. Given mild intrahepatic ductal dilatation, cholangiocarcinoma variant would be a secondary consideration. Consider focused ultrasound of the left hepatic lobe to allow possible ultrasound-guided sampling. 3. Enhancing left renal lesion, consistent with renal cell carcinoma. 4. Pancreatic findings which are favored to be the sequelae of chronic pancreatitis. No evidence of acute inflammation or dominant suspicious pancreatic mass. 5. Splenomegaly, likely related to the portal venous hypertension.   Electronically Signed   By: Abigail Miyamoto M.D.   On: 08/27/2014 08:48   Ct Abdomen Pelvis W  Contrast  08/21/2014   CLINICAL DATA:  Lower abdominal pain for 2 weeks  EXAM: CT ABDOMEN AND PELVIS WITH CONTRAST  TECHNIQUE: Multidetector CT imaging of the abdomen and pelvis was performed using the standard protocol following bolus administration of intravenous contrast.  CONTRAST:  151mL OMNIPAQUE IOHEXOL 300 MG/ML  SOLN  COMPARISON:  12/25/2008  FINDINGS: Lung bases are free of acute infiltrate or sizable effusion.  The gallbladder is been surgically removed. There is heterogeneity identified in the medial segment of the left lobe of the liver with extension inferiorly growing into the portal vein consistent with tumor thrombus. The tumor thrombus measures approximately 4.7 cm in greatest craniocaudad projection and appears to extend into both the left and right portal veins with bland appearing thrombus within the right portal vein as well as suggestion of bland thrombus within the main portal vein proximally. An large recanalized paraumbilical vein is noted although its enhancement pattern is decreased when compared with the prior CT examination which could be related to  tumor thrombus as well. Hypertrophy of the caudate lobe is noted.  The spleen is mildly prominent but stable from previous exam. The adrenal glands and pancreas are well visualized. There again noted cystic changes within the pancreas. Many of the cystic areas are stable from previous exam however in the head of the pancreas the dominant area now measures 2.6 cm. This could represent a cystic neoplasm within pancreas. Some calcifications are noted as well. Kidneys are well visualized bilaterally without renal calculi or obstructive changes. In the midportion of the left left kidney anteriorly there is a peripherally enhancing mass lesion measuring approximately 17 mm. This is best seen on image number 38 of series 2 and is confirmed on delayed imaging is best seen on image number 16 of series 7. This must be viewed with suspicion for an  underlying renal cell carcinoma.  The appendix is not well visualized although no inflammatory changes are seen. There is some engorgement within the mesenteric venous structures consistent with the centrally predominately obstructing lesion within the main portal vein. The prostate is enlarged indenting upon the inferior aspect of the bladder. Postsurgical changes in the right hip are noted. No other acute bony abnormality is seen.  IMPRESSION: Diffuse heterogeneity within the medial segment of the left lobe of the liver with extension into the main portal vein as well as the right and left portal veins within the liver. Although this may represent a primary hepatic neoplasm the possibility of metastatic disease would deserve consideration as well. There also appears to be bland thrombus within the right portal venous branches as well as the main portal vein proximal to the nearly obstructing lesion at the bifurcation of the portal vein.  Enlarging cystic areas within the pancreas particularly in the head and uncinate process. The possibility of a cystic neoplasm would deserve consideration and the changes in the liver may represent metastatic disease.  Peripherally enhancing mass lesion within the anterior aspect of the left kidney as described. Suspicion for underlying neoplasm is present.  MRI of the abdomen with and without contrast material is recommended for further evaluation of these abnormalities listed above.  These results were called by telephone at the time of interpretation on 08/21/2014 at 3:06 pm to Dr. Sherilyn Cooter , who verbally acknowledged these results.   Electronically Signed   By: Inez Catalina M.D.   On: 08/21/2014 15:19     ASSESSMENT / PLAN:   1. Infiltrative large liver lesion, left lobe seen on both CT scan and MRI of the liver suspicious for primary liver malignancy versus      metastatic disease. CT scan also shows tumor thrombus extending into portal vein. 08/22/2014 - serum CEA (2.7) and  AFP (4.6) are normal. CA-19-9 minimally elevated at 48.0. - Reviewed labs and independently reviewed recent MRI and CT scans and discussed with patient and wife present. Have explained that there is still strong suspicion for possible malignancy in the left lobe of the liver with tumor thrombus into the portal vein. Serum tumor markers shows only minimally elevated CA-19-9, radiologist felt that cystic mass in the pancreas is more likely sequelae of chronic pancreatitis. Plan at this time is to pursue ultrasound-guided liver biopsy to get tissue diagnosis. Patient is agreeable to this, will see him back after present is available and make further plan of management. 2. Left kidney mass measuring 17 mm on CT scan. MRI abdomen also reports suspicion for kidney cancer - we will defer to urology Dr. Erlene Quan for  evaluation for possible kidney cancer.  3. Cystic abnormality in pancreas  - ? Sequelae of chronic pancreatitis versus other etiology - currently no abdominal pain or other GI symptoms. 4. Iron deficiency diagnosed on labs of 08/22/2014, hemoglobin normal at 14.0 - will refer to GI Dr. Vira Agar since last colonoscopy was many years ago, and has history of ulcerative colitis. Patient advised to increase oral iron to 3 times per day if tolerated.  5. Chronic low back pain - MRI lumbar spine negative for lytic lesions are metastasis, reports degenerative changes and spinal stenosis. Patient does not want see a spine specialist at this time since tramadol is controlling pain med.     6. In between visits, the patient has been advised to call or come to the ER in case of worsening pain, acute sickness or new symptoms. Patient is agreeable to this plan.    Leia Alf, MD   08/27/2014 9:17 PM

## 2014-08-29 ENCOUNTER — Other Ambulatory Visit: Payer: Self-pay | Admitting: Radiology

## 2014-09-01 ENCOUNTER — Other Ambulatory Visit: Payer: Self-pay | Admitting: Radiology

## 2014-09-01 ENCOUNTER — Ambulatory Visit
Admission: RE | Admit: 2014-09-01 | Discharge: 2014-09-01 | Disposition: A | Discharge status: Home / Self Care | Payer: Medicare Other | Source: Ambulatory Visit | Attending: Internal Medicine | Admitting: Internal Medicine

## 2014-09-01 DIAGNOSIS — K7460 Unspecified cirrhosis of liver: Secondary | ICD-10-CM | POA: Insufficient documentation

## 2014-09-01 DIAGNOSIS — R160 Hepatomegaly, not elsewhere classified: Secondary | ICD-10-CM

## 2014-09-01 DIAGNOSIS — N400 Enlarged prostate without lower urinary tract symptoms: Secondary | ICD-10-CM | POA: Insufficient documentation

## 2014-09-01 DIAGNOSIS — N2889 Other specified disorders of kidney and ureter: Secondary | ICD-10-CM | POA: Insufficient documentation

## 2014-09-01 DIAGNOSIS — C228 Malignant neoplasm of liver, primary, unspecified as to type: Secondary | ICD-10-CM | POA: Diagnosis not present

## 2014-09-01 DIAGNOSIS — E119 Type 2 diabetes mellitus without complications: Secondary | ICD-10-CM | POA: Insufficient documentation

## 2014-09-01 DIAGNOSIS — M109 Gout, unspecified: Secondary | ICD-10-CM | POA: Diagnosis not present

## 2014-09-01 DIAGNOSIS — K859 Acute pancreatitis, unspecified: Secondary | ICD-10-CM | POA: Insufficient documentation

## 2014-09-01 DIAGNOSIS — D509 Iron deficiency anemia, unspecified: Secondary | ICD-10-CM | POA: Insufficient documentation

## 2014-09-01 DIAGNOSIS — I10 Essential (primary) hypertension: Secondary | ICD-10-CM | POA: Insufficient documentation

## 2014-09-01 DIAGNOSIS — E785 Hyperlipidemia, unspecified: Secondary | ICD-10-CM | POA: Insufficient documentation

## 2014-09-01 DIAGNOSIS — K279 Peptic ulcer, site unspecified, unspecified as acute or chronic, without hemorrhage or perforation: Secondary | ICD-10-CM | POA: Insufficient documentation

## 2014-09-01 MED ORDER — MIDAZOLAM HCL 2 MG/2ML IJ SOLN
INTRAMUSCULAR | Status: AC | PRN
  Administered 2014-09-01 (×2): 1 mg via INTRAVENOUS

## 2014-09-01 MED ORDER — SODIUM CHLORIDE 0.9 % IV SOLN
INTRAVENOUS | Status: DC
  Administered 2014-09-01: 13:00:00 via INTRAVENOUS

## 2014-09-01 MED ORDER — FENTANYL CITRATE (PF) 100 MCG/2ML IJ SOLN
INTRAMUSCULAR | Status: AC | PRN
  Administered 2014-09-01 (×2): 50 ug via INTRAVENOUS

## 2014-09-01 NOTE — Discharge Instructions (Signed)
Liver Biopsy, Care After °Refer to this sheet in the next few weeks. These instructions provide you with information on caring for yourself after your procedure. Your health care provider may also give you more specific instructions. Your treatment has been planned according to current medical practices, but problems sometimes occur. Call your health care provider if you have any problems or questions after your procedure. °WHAT TO EXPECT AFTER THE PROCEDURE °After your procedure, it is typical to have the following: °· A small amount of discomfort in the area where the biopsy was done and in the right shoulder or shoulder blade. °· A small amount of bruising around the area where the biopsy was done and on the skin over the liver. °· Sleepiness and fatigue for the rest of the day. °HOME CARE INSTRUCTIONS  °· Rest at home for 1-2 days or as directed by your health care provider. °· Have a friend or family member stay with you for at least 24 hours. °· Because of the medicines used during the procedure, you should not do the following things in the first 24 hours: °¨ Drive. °¨ Use machinery. °¨ Be responsible for the care of other people. °¨ Sign legal documents. °¨ Take a bath or shower. °· There are many different ways to close and cover an incision, including stitches, skin glue, and adhesive strips. Follow your health care provider's instructions on: °¨ Incision care. °¨ Bandage (dressing) changes and removal. °¨ Incision closure removal. °· Do not drink alcohol in the first week. °· Do not lift more than 5 pounds or play contact sports for 2 weeks after this test. °· Take medicines only as directed by your health care provider. Do not take medicine containing aspirin or non-steroidal anti-inflammatory medicines such as ibuprofen for 1 week after this test. °· It is your responsibility to get your test results. °SEEK MEDICAL CARE IF:  °· You have increased bleeding from an incision that results in more than a  small spot of blood. °· You have redness, swelling, or increasing pain in any incisions. °· You notice a discharge or a bad smell coming from any of your incisions. °· You have a fever or chills. °SEEK IMMEDIATE MEDICAL CARE IF:  °· You develop swelling, bloating, or pain in your abdomen. °· You become dizzy or faint. °· You develop a rash. °· You are nauseous or vomit. °· You have difficulty breathing, feel short of breath, or feel faint. °· You develop chest pain. °· You have problems with your speech or vision. °· You have trouble balancing or moving your arms or legs. °Document Released: 08/13/2004 Document Revised: 06/10/2013 Document Reviewed: 03/22/2013 °[employer]® Patient Information ©2015 [employer]. This information is not intended to replace advice given to you by your health care provider. Make sure you discuss any questions you have with your health care provider. ° °

## 2014-09-01 NOTE — Procedures (Signed)
[place] core L lobe 18g x3 to surg path No complication No blood loss. See complete dictation in [facility].

## 2014-09-01 NOTE — OR Nursing (Signed)
Dr Vernard Gambles notified that pt had labs 7/15, no new labs needed per md. (will still draw CBG since pt diabetic)

## 2014-09-02 ENCOUNTER — Ambulatory Visit (INDEPENDENT_AMBULATORY_CARE_PROVIDER_SITE_OTHER): Payer: Medicare Other | Admitting: Urology

## 2014-09-02 ENCOUNTER — Encounter: Payer: Self-pay | Admitting: Urology

## 2014-09-02 VITALS — BP 166/86 | HR 53 | Ht 74.0 in | Wt 194.8 lb

## 2014-09-02 DIAGNOSIS — I10 Essential (primary) hypertension: Secondary | ICD-10-CM | POA: Insufficient documentation

## 2014-09-02 DIAGNOSIS — N2889 Other specified disorders of kidney and ureter: Secondary | ICD-10-CM

## 2014-09-02 DIAGNOSIS — R160 Hepatomegaly, not elsewhere classified: Secondary | ICD-10-CM

## 2014-09-02 DIAGNOSIS — E785 Hyperlipidemia, unspecified: Secondary | ICD-10-CM | POA: Insufficient documentation

## 2014-09-02 DIAGNOSIS — M109 Gout, unspecified: Secondary | ICD-10-CM | POA: Insufficient documentation

## 2014-09-02 DIAGNOSIS — E119 Type 2 diabetes mellitus without complications: Secondary | ICD-10-CM | POA: Insufficient documentation

## 2014-09-02 DIAGNOSIS — K5190 Ulcerative colitis, unspecified, without complications: Secondary | ICD-10-CM | POA: Insufficient documentation

## 2014-09-02 DIAGNOSIS — K861 Other chronic pancreatitis: Secondary | ICD-10-CM | POA: Insufficient documentation

## 2014-09-02 DIAGNOSIS — N400 Enlarged prostate without lower urinary tract symptoms: Secondary | ICD-10-CM | POA: Insufficient documentation

## 2014-09-02 LAB — URINALYSIS, COMPLETE
BILIRUBIN UA: POSITIVE
LEUKOCYTES UA: NEGATIVE
Nitrite, UA: NEGATIVE
PH UA: 5.5 (ref 5.0–7.5)
RBC, UA: NEGATIVE
Specific Gravity, UA: 1.02 (ref 1.005–1.030)
UUROB: 1 mg/dL (ref 0.2–1.0)

## 2014-09-02 LAB — MICROSCOPIC EXAMINATION: Bacteria, UA: NONE SEEN

## 2014-09-02 LAB — SURGICAL PATHOLOGY

## 2014-09-02 NOTE — Progress Notes (Signed)
09/02/2014 4:54 PM   Angel Franklin [birth year] [id number]  Referring provider: Idelle Crouch, MD [place], [place] [address]  Chief Complaint  Patient presents with  . Renal Mass    New Patient    HPI: 74 year old male referred by Dr. Ma Hillock and it for an incidental 17 mm left renal mass.  Patient is currently being worked up for an infiltrative large liver lesion within the left lobe suspicious for either primary malignancy versus metastatic disease. 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  As part of the workup, he had imaging including a CT of the abdomen and pelvis as well as an MRI which show a suspicious 17 mm enhancing left renal mass.  He denies any gross hematuria or urinary symptoms. He does have chronic lower back and abdominal pain of unclear etiology. No obvious flank pain.  He denies a family history of renal cell carcinoma.  He does have a personal history of kidney stones.  Overall, he has not been doing very well over the past month. He's lost a good amount of weight due to poor appetite. He and his wife are very anxious to find out the results of the liver biopsy and how to proceed.   PMH: Past Medical History  Diagnosis Date  . Diabetes mellitus without complication   . Hypertension   . Hyperlipidemia   . Gout   . BPH (benign prostatic hypertrophy)   . PUD (peptic ulcer disease)   . Pancreatitis, chronic   . Ulcerative colitis   . Heart disease     Surgical History: Past Surgical History  Procedure Laterality Date  . Appendectomy    . Coronary artery bypass graft      1995  . Total hip arthroplasty Right 06/21/13  . Cholecystectomy      Home Medications:    Medication List       This list is accurate as of: 09/02/14 11:59 PM.  Always use your most recent med list.               acetaminophen 500 MG tablet  Commonly known as:  TYLENOL  Take 1,000 mg by  mouth every 6 (six) hours as needed.     allopurinol 300 MG tablet  Commonly known as:  ZYLOPRIM  Take 300 mg by mouth daily.     aspirin EC 81 MG tablet  Take 81 mg by mouth daily.     atenolol 25 MG tablet  Commonly known as:  TENORMIN  Take by mouth daily.     ferrous sulfate 325 (65 FE) MG tablet  Take 325 mg by mouth daily with breakfast.     insulin aspart 100 UNIT/ML injection  Commonly known as:  novoLOG  Inject 10 Units into the skin 3 (three) times daily before meals.     insulin glargine 100 UNIT/ML injection  Commonly known as:  LANTUS  Inject 30 Units into the skin at bedtime.     loperamide 2 MG capsule  Commonly known as:  IMODIUM  Take by mouth as needed for diarrhea or loose stools.     mesalamine 400 MG EC tablet  Commonly known as:  ASACOL  Take 400 mg by mouth 3 (three) times daily. 2 tablets by mouth three times a day     multivitamin tablet  Take 1 tablet by mouth daily.     pravastatin 40 MG tablet  Commonly known as:  PRAVACHOL  Take 40 mg by mouth daily.     predniSONE 20 MG tablet  Commonly known as:  DELTASONE     traMADol 50 MG tablet  Commonly known as:  ULTRAM  Take 1 tablet (50 mg total) by mouth every 6 (six) hours as needed.     vitamin B-12 250 MCG tablet  Commonly known as:  CYANOCOBALAMIN  Take 250 mcg by mouth daily.        Allergies:  Allergies  Allergen Reactions  . Propofol Other (See Comments)    Makes him very sick.    Family History: Family History  Problem Relation Age of Onset  . Prostate cancer Father   . Heart attack Father     Social History:  reports that he quit smoking about 21 years ago. He quit smokeless tobacco use about 21 years ago. He reports that he does not drink alcohol or use illicit drugs.  ROS: UROLOGY Frequent Urination?: No Hard to postpone urination?: No Burning/pain with urination?: No Get up at night to urinate?: Yes Leakage of urine?: No Urine stream starts and stops?:  No Trouble starting stream?: No Do you have to strain to urinate?: No Blood in urine?: No Urinary tract infection?: No Sexually transmitted disease?: No Injury to kidneys or bladder?: No Painful intercourse?: No Weak stream?: No Erection problems?: No Penile pain?: No  Gastrointestinal Nausea?: No Vomiting?: No Indigestion/heartburn?: No Diarrhea?: No Constipation?: No  Constitutional Fever: No Night sweats?: No Weight loss?: Yes Fatigue?: No  Skin Skin rash/lesions?: No Itching?: No  Eyes Blurred vision?: No Double vision?: No  Ears/Nose/Throat Sore throat?: No Sinus problems?: No  Hematologic/Lymphatic Swollen glands?: No Easy bruising?: No  Cardiovascular Leg swelling?: No Chest pain?: No  Respiratory Cough?: No Shortness of breath?: No  Endocrine Excessive thirst?: No  Musculoskeletal Back pain?: Yes Joint pain?: No  Neurological Headaches?: No Dizziness?: No  Psychologic Depression?: No Anxiety?: No  Physical Exam: BP 166/86 mmHg  Pulse 53  Ht 6\' 2"  (1.88 m)  Wt 194 lb 12.8 oz (88.361 kg)  BMI 25.00 kg/m2  Constitutional:  Alert and oriented, No acute distress.  Presents to the office today with his wife. HEENT: [place] AT, moist mucus membranes.  Trachea midline, no masses.  Slight scleral icterus noted Cardiovascular: No clubbing, cyanosis, or edema. Respiratory: Normal respiratory effort, no increased work of breathing. GI: Abdomen is soft, nontender, nondistended, no abdominal masses.  Green prep from liver biopsy still present on patient's abdomen. GU: No CVA tenderness.  Skin: No rashes, bruises or suspicious lesions. Lymph: No cervical or inguinal adenopathy. Neurologic: Grossly intact, no focal deficits, moving all 4 extremities. Psychiatric: Normal mood and affect.  Laboratory Data: Lab Results  Component Value Date   WBC 6.9 08/22/2014   HGB 14.0 08/22/2014   HCT 43.2 08/22/2014   HCT 42.0 08/22/2014   MCV 88.2  08/22/2014   PLT 95* 08/22/2014    Lab Results  Component Value Date   CREATININE 1.20 08/22/2014     Urinalysis Results for orders placed or performed in visit on 09/02/14  Microscopic Examination  Result Value Ref Range   WBC, UA 0-5 0 -  5 /hpf   RBC, UA 0-2 0 -  2 /hpf   Epithelial Cells (non renal) 0-10 0 - 10 /hpf   Renal Epithel, UA 0-10 (A) None seen /hpf   Bacteria, UA None seen None seen/Few  Urinalysis, Complete  Result Value Ref Range  Specific Gravity, UA 1.020 1.005 - 1.030   pH, UA 5.5 5.0 - 7.5   Color, UA Amber (A) Yellow   Appearance Ur Clear Clear   Leukocytes, UA Negative Negative   Protein, UA 1+ (A) Negative/Trace   Glucose, UA 1+ (A) Negative   Ketones, UA Trace (A) Negative   RBC, UA Negative Negative   Bilirubin, UA Positive Negative   Urobilinogen, Ur 1.0 0.2 - 1.0 mg/dL   Nitrite, UA Negative Negative   Microscopic Examination See below:     Pertinent Imaging: CLINICAL DATA: Abnormal left lobe of liver on prior CT. Pancreatic cystic lesions. Diabetes. Hypertension. No primary malignancy history submitted.  EXAM: MRI ABDOMEN WITHOUT AND WITH CONTRAST  TECHNIQUE: Multiplanar multisequence MR imaging of the abdomen was performed both before and after the administration of intravenous contrast.  CONTRAST: 53mL MULTIHANCE GADOBENATE DIMEGLUMINE 529 MG/ML IV SOLN  COMPARISON: CT of 08/21/2014 and 12/25/2008  FINDINGS: Lower chest: Normal heart size without pericardial or pleural effusion.  Hepatobiliary: Mild caudate lobe enlargement. Enhancing thrombus within an enlarged portal vein, and left greater than right branches. This begins in the porta hepatis, including on image 58 of series 18. Within the majority of the left lobe of the liver, heterogeneous delayed post-contrast enhancement is suspicious for infiltrative tumor. Example 6.3 x 8.2 cm on image 30 of series 17 and 5.8 x 7.1 cm on image 70 of series 18. No  well-defined T2 abnormality seen. No right-sided mass identified. Status post cholecystectomy. Mild left sided intrahepatic ductal dilatation, most apparent in the lateral segment on image 12 of series 9.  The common duct is normal in caliber, without evidence of choledocholithiasis or obstructive mass.  Pancreas: Borderline main pancreatic duct dilatation with multiple cystic foci throughout the pancreas. The largest is within the head/uncinate process and measures 1.7 cm. No dominant or highly suspicious pancreatic mass seen. No abnormal enhancement within the cystic foci.  Spleen: Borderline splenomegaly, 14.2 cm craniocaudal. A hypoenhancing focus within the medial spleen measures 8 mm and is of doubtful clinical significance.  Adrenals/Urinary Tract: Normal adrenal glands. Normal right kidney.  anterior interpolar left renal lesion measures 1.3 cm on image 24 of series 3. Demonstrates post-contrast enhancement, including on image 82 of series 16. No hydronephrosis.  Stomach/Bowel: Normal stomach and abdominal bowel loops.  Vascular/Lymphatic: Normal caliber of the aorta and branch vessels. Multiple right renal arteries. No splenic artery aneurysm. Recannulized paraumbilical vein. Portal vein thrombosis detailed above. Splenic vein and superior mesenteric vein both patent. No retroperitoneal or retrocrural adenopathy.  Other: No ascites.  Musculoskeletal: No acute osseous abnormality.  IMPRESSION: 1. 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 2. Heterogeneous delayed enhancement within the left lobe of the liver. Concurrent mild caudate lobe enlargement. Findings are suspicious for infiltrative hepatocellular carcinoma. Given mild intrahepatic ductal dilatation, cholangiocarcinoma variant would be a secondary consideration. Consider focused ultrasound of the left hepatic lobe to allow possible ultrasound-guided  sampling. 3. Enhancing left renal lesion, consistent with renal cell carcinoma. 4. Pancreatic findings which are favored to be the sequelae of chronic pancreatitis. No evidence of acute inflammation or dominant suspicious pancreatic mass. 5. Splenomegaly, likely related to the portal venous hypertension.   Electronically Signed  By: Abigail Miyamoto M.D.  On: 08/27/2014 08:48   Assessment & Plan:  74 year old male with very large left liver mass status post biopsy yesterday. He also has an incidental 17 mm enhancing left renal mass which is suspicious for renal  cell carcinoma.  A solid renal mass raises the suspicion of primary renal malignancy.  We discussed this in detail and in regards to the spectrum of renal masses which includes cysts (pure cysts are considered benign), solid masses and everything in between. The risk of metastasis increases as the size of solid renal mass increases. In general, it is believed that the risk of metastasis for renal masses less than 3-4 cm is small (up to approximately 5%) based mainly on large retrospective studies.  In some cases and especially in patients of older age and multiple comorbidities a surveillance approach may be appropriate. The treatment of solid renal masses includes: surveillance, cryoablation (percutaneous and laparoscopic) in addition to partial and complete nephrectomy (each with option of laparoscopic, robotic and open depending on appropriateness). Furthermore, nephrectomy appears to be an independent risk factor for the development of chronic kidney disease suggesting that nephron sparing approaches should be implored whenever feasible. We reviewed these options in context of the patients current situation as well as the pros and cons of each.    We discussed today that there is very little concerned that this small renal mass has metastasized to the liver. Liver likely represents a second primary malignancy, pathology pending. I  counseled him that until the final diagnosis is confirmed and his treatment course and prognosis is determined, I would recommend surveillance of this left renal mass visits quite small. We will consider treatment down the road with surveillance in 6 months pending the above.  1. Left kidney mass As above, plan for - Urinalysis, Complete  2. Liver mass Biopsy pending, will discuss further at tumor board.  Return in about 6 months (around 03/05/2015) for f/u renal mass.  Hollice Espy, MD  [facility] [address] [place], [place] [address] [phone]

## 2014-09-03 ENCOUNTER — Encounter: Payer: Self-pay | Admitting: Urology

## 2014-09-04 ENCOUNTER — Telehealth: Payer: Self-pay | Admitting: *Deleted

## 2014-09-04 NOTE — Telephone Encounter (Signed)
I called Angel Franklin back and told her the results are positive for cancer, she asked where it was from and I informed her that the liver was the origin, she had several other question which I deferred to be asked at the FU appt on next Friday. She stated she understood and would wait until then

## 2014-09-05 ENCOUNTER — Telehealth: Payer: Self-pay | Admitting: *Deleted

## 2014-09-05 NOTE — Telephone Encounter (Signed)
Try cutting dose in half if pill can be broken or cut. If that does not help, can try Norco  Vo Dr Grayland Ormond bce Spoke with Neldon [place] and she stated the Tramadol is breakable and she is willing to try halfing the dose and will call back if no improvement

## 2014-09-05 NOTE — Telephone Encounter (Signed)
Sleeping with med and he is flailing his arms moving legs as if he is running, mumbling and difficult to arouse. Only occurs when taking the Tramadol

## 2014-09-12 ENCOUNTER — Other Ambulatory Visit: Payer: Self-pay | Admitting: *Deleted

## 2014-09-12 ENCOUNTER — Inpatient Hospital Stay: Payer: Medicare Other

## 2014-09-12 ENCOUNTER — Inpatient Hospital Stay: Payer: Medicare Other | Attending: Internal Medicine | Admitting: Internal Medicine

## 2014-09-12 VITALS — BP 170/84 | HR 83 | Temp 96.6°F | Resp 18 | Ht 74.0 in | Wt 191.6 lb

## 2014-09-12 DIAGNOSIS — K8689 Other specified diseases of pancreas: Secondary | ICD-10-CM

## 2014-09-12 DIAGNOSIS — N400 Enlarged prostate without lower urinary tract symptoms: Secondary | ICD-10-CM | POA: Diagnosis not present

## 2014-09-12 DIAGNOSIS — I10 Essential (primary) hypertension: Secondary | ICD-10-CM | POA: Diagnosis not present

## 2014-09-12 DIAGNOSIS — E119 Type 2 diabetes mellitus without complications: Secondary | ICD-10-CM | POA: Insufficient documentation

## 2014-09-12 DIAGNOSIS — I81 Portal vein thrombosis: Secondary | ICD-10-CM | POA: Diagnosis not present

## 2014-09-12 DIAGNOSIS — Z794 Long term (current) use of insulin: Secondary | ICD-10-CM | POA: Insufficient documentation

## 2014-09-12 DIAGNOSIS — K861 Other chronic pancreatitis: Secondary | ICD-10-CM | POA: Insufficient documentation

## 2014-09-12 DIAGNOSIS — M545 Low back pain, unspecified: Secondary | ICD-10-CM

## 2014-09-12 DIAGNOSIS — R1011 Right upper quadrant pain: Secondary | ICD-10-CM | POA: Insufficient documentation

## 2014-09-12 DIAGNOSIS — Z79899 Other long term (current) drug therapy: Secondary | ICD-10-CM | POA: Diagnosis not present

## 2014-09-12 DIAGNOSIS — N2889 Other specified disorders of kidney and ureter: Secondary | ICD-10-CM | POA: Diagnosis not present

## 2014-09-12 DIAGNOSIS — C220 Liver cell carcinoma: Secondary | ICD-10-CM | POA: Diagnosis present

## 2014-09-12 DIAGNOSIS — R5383 Other fatigue: Secondary | ICD-10-CM | POA: Diagnosis not present

## 2014-09-12 DIAGNOSIS — E611 Iron deficiency: Secondary | ICD-10-CM | POA: Diagnosis not present

## 2014-09-12 DIAGNOSIS — R160 Hepatomegaly, not elsewhere classified: Secondary | ICD-10-CM

## 2014-09-12 DIAGNOSIS — Z7982 Long term (current) use of aspirin: Secondary | ICD-10-CM | POA: Insufficient documentation

## 2014-09-12 DIAGNOSIS — Z8639 Personal history of other endocrine, nutritional and metabolic disease: Secondary | ICD-10-CM | POA: Diagnosis not present

## 2014-09-12 DIAGNOSIS — R42 Dizziness and giddiness: Secondary | ICD-10-CM | POA: Diagnosis not present

## 2014-09-12 DIAGNOSIS — Z87891 Personal history of nicotine dependence: Secondary | ICD-10-CM | POA: Diagnosis not present

## 2014-09-12 DIAGNOSIS — K869 Disease of pancreas, unspecified: Secondary | ICD-10-CM | POA: Diagnosis not present

## 2014-09-12 DIAGNOSIS — E785 Hyperlipidemia, unspecified: Secondary | ICD-10-CM

## 2014-09-12 DIAGNOSIS — Z9049 Acquired absence of other specified parts of digestive tract: Secondary | ICD-10-CM | POA: Diagnosis not present

## 2014-09-12 DIAGNOSIS — M5136 Other intervertebral disc degeneration, lumbar region: Secondary | ICD-10-CM | POA: Insufficient documentation

## 2014-09-12 MED ORDER — FENTANYL 12 MCG/HR TD PT72: 12.0000 ug | MEDICATED_PATCH | TRANSDERMAL | Status: AC

## 2014-09-12 MED ORDER — TRAMADOL HCL 50 MG PO TABS: ORAL_TABLET | ORAL | Status: DC

## 2014-09-12 NOTE — Progress Notes (Signed)
Patient states that he does not feel very good at all today. He states that he has a constant pain in his abd. He rate this pain a 6-7/10. He states that his appetite has not been that great, but he is eating.

## 2014-09-14 LAB — ANA W/REFLEX: Anti Nuclear Antibody(ANA): NEGATIVE

## 2014-09-14 LAB — HEPATITIS C ANTIBODY: HCV Ab: <0.1 s/co ratio (ref 0.0–0.9)

## 2014-09-15 ENCOUNTER — Telehealth: Payer: Self-pay | Admitting: *Deleted

## 2014-09-15 ENCOUNTER — Other Ambulatory Visit: Payer: Self-pay | Admitting: *Deleted

## 2014-09-15 LAB — HEPATITIS B SURFACE ANTIGEN: Hepatitis B Surface Ag: NEGATIVE

## 2014-09-15 NOTE — Telephone Encounter (Signed)
I spoke with wife and explained Dr Candelaria Stagers response to her

## 2014-09-15 NOTE — Telephone Encounter (Signed)
Angel Franklin is working on the Interventional Radiology appt for liver cancer treatment. Per our discussion, patient stated he would call his eye doctor. If they do not have one please make an appt. Thanks.

## 2014-09-15 NOTE — Telephone Encounter (Signed)
Called wife back and spoke with her about I had called eye doctor on Friday and left message and has not heard back from them.  She states she called eye doctor today and they are ok with appt and I told her dr pandit felt it would be ok also.  I did call [facility] and left message and they wanted faxed papers and I faxed papers to [phone].  I told wife that I would wait until lunch and then call back if I have not heard from them. She is agreeable and wants to be called on her [phone]

## 2014-09-15 NOTE — Telephone Encounter (Signed)
Called stating that she was expecting a call form Dr Candelaria Stagers nurse regarding the "eye appt for tomorrow and the appt for radiologist to get his chemotherapy"

## 2014-09-15 NOTE — Progress Notes (Signed)
[place]  [phone] [phone]     ID: Angel Franklin OB: 08/15/1940  MR#: [id number]  HBZ#:[id number]  Patient Care Team: Idelle Crouch, MD as PCP - General (Unknown Physician Specialty) Clent Jacks, RN as Registered Nurse  CHIEF COMPLAINT/DIAGNOSIS:  1. Newly diagnosed hepatocellular carcinoma on liver US-biopsy done 09/01/14, at least clinical stage III given large liver lesion and portal vein involvement.     09/01/14 - Liver biopsy Pathology Report - DIAGNOSIS:  A. LIVER MASS, LEFT LOBE; ULTRASOUND GUIDED BIOPSY:      HEPATOCELLULAR CARCINOMA, GRADE 3 (EDMONDSON AND STEINER CLASSIFICATION).  BACKGROUND LIVER WITH     CHANGES CONSISTENT WITH CIRRHOSIS.  (Presented with Infiltrative large liver lesion, left lobe seen on both CT scan and MRI of the liver suspicious for primary liver malignancy versus  metastatic disease. CT scan also shows tumor thrombus extending into portal vein).  08/22/14 - serum CEA (2.7) and AFP (4.6) are normal. CA-19-9 minimally elevated at 48.0.  2. Left kidney mass measuring 17 mm on CT scan. MRI abdomen also reports suspicion for kidney cancer - seen by urologist Dr. Erlene Quan and recommended observation for now given diagnosis of large advanced liver cancer.   3. Cystic abnormality in pancreas  - ? Sequelae of chronic pancreatitis versus other etiology.  4. Iron deficiency diagnosed on labs of 08/22/14, hemoglobin normal at 14.0.   HISTORY OF PRESENT ILLNESS:  Patient returns for continued oncology follow-up, states that he continues to have some fatigue on exertion but remains physically active. He is concerned about liver biopsy showing hepatocellular carcinoma. He is accompanied by his wife, son and daughter-in-law today. States that low back pain was much better after starting taking tramadol when necessary, but was having increased drowsiness and is now trying half tablet but states that this also is causing  side effects. He has constant pain and fluctuates 2-8/10. Eating steady. No bright red blood in stools, melena or hematuria.Marland Kitchen  REVIEW OF SYSTEMS:   ROS  As in HPI above. In addition, no fever, chills. No new headaches or focal weakness.  No sore throat, cough, shortness of breath, sputum, hemoptysis or chest pain. No dysuria or hematuria. No new skin rash or bleeding symptoms. No new paresthesias in extremities.  PS ECOG 1  PAST MEDICAL HISTORY: Reviewed. Past Medical History  Diagnosis Date  . Diabetes mellitus without complication   . Hypertension   . Hyperlipidemia   . Gout   . BPH (benign prostatic hypertrophy)   . PUD (peptic ulcer disease)   . Pancreatitis, chronic   . Ulcerative colitis   . Heart disease     PAST SURGICAL HISTORY: Reviewed. Past Surgical History  Procedure Laterality Date  . Appendectomy    . Coronary artery bypass graft      1995  . Total hip arthroplasty Right 06/21/13  . Cholecystectomy      FAMILY HISTORY: Reviewed. Father had prostate cancer , denies other maligancy.  SOCIAL HISTORY: Reviewed. History  Substance Use Topics  . Smoking status: Former Smoker    Quit date: 06/07/1993  . Smokeless tobacco: Former Systems developer    Quit date: 06/07/1993  . Alcohol Use: No     Comment: quit drinking in Nov 1975    Allergies  Allergen Reactions  . Propofol Other (See Comments)    Makes him very sick.    Current Outpatient Prescriptions  Medication Sig Dispense Refill  . acetaminophen (TYLENOL) 500 MG tablet Take 1,000 mg  by mouth every 6 (six) hours as needed.    Marland Kitchen allopurinol (ZYLOPRIM) 300 MG tablet Take 300 mg by mouth daily.    Marland Kitchen aspirin EC 81 MG tablet Take 81 mg by mouth daily.    Marland Kitchen atenolol (TENORMIN) 25 MG tablet Take by mouth daily.    . ferrous sulfate 325 (65 FE) MG tablet Take 325 mg by mouth daily with breakfast.    . insulin aspart (NOVOLOG) 100 UNIT/ML injection Inject 10 Units into the skin 3 (three) times daily before meals.    .  insulin glargine (LANTUS) 100 UNIT/ML injection Inject 30 Units into the skin at bedtime.    Marland Kitchen loperamide (IMODIUM) 2 MG capsule Take by mouth as needed for diarrhea or loose stools.    . mesalamine (ASACOL) 400 MG EC tablet Take 400 mg by mouth 3 (three) times daily. 2 tablets by mouth three times a day    . Multiple Vitamin (MULTIVITAMIN) tablet Take 1 tablet by mouth daily.    . pravastatin (PRAVACHOL) 40 MG tablet Take 40 mg by mouth daily.    . predniSONE (DELTASONE) 20 MG tablet     . traMADol (ULTRAM) 50 MG tablet Take one-half tablet once every 6 hours as needed for pain 60 tablet 0  . vitamin B-12 (CYANOCOBALAMIN) 250 MCG tablet Take 250 mcg by mouth daily.    . fentaNYL (DURAGESIC - DOSED MCG/HR) 12 MCG/HR Place 1 patch (12.5 mcg total) onto the skin every 3 (three) days. 10 patch 0   No current facility-administered medications for this visit.    PHYSICAL EXAM: Filed Vitals:   09/12/14 1457  BP: 170/84  Pulse: 83  Temp: 96.6 F (35.9 C)  Resp: 18     Body mass index is 24.59 kg/(m^2).    ECOG FS:1 - Symptomatic but completely ambulatory  GENERAL: Alert and oriented and in no acute distress. No icterus. HEENT: EOMs intact. No cervical lymphadenopathy. CVS: S1S2, regular LUNGS: Bilaterally clear to auscultation, no rhonchi. ABDOMEN: Soft, nontender. Liver is palpable.  EXTREMITIES: No pedal edema.  LAB RESULTS: 08/20/2014 - hemoglobin 13.5, WBC 7-100, platelets 115, ANC 4870, MCV 91.1.     08/22/2014 - serum CEA (2.7) and AFP (4.6) are normal. CA-19-9 minimally elevated at 48.0.  STUDIES: Mr Lumbar Spine W Wo Contrast  08/26/2014   CLINICAL DATA:  74 year old male with lumbar back pain for 3 weeks. Known pancreatic and liver lesions. Subsequent encounter.  EXAM: MRI LUMBAR SPINE WITHOUT AND WITH CONTRAST  TECHNIQUE: Multiplanar and multiecho pulse sequences of the lumbar spine were obtained without and with intravenous contrast.  CONTRAST:  18 mL MultiHance, in  conjunction with contrast enhanced imaging of the abdomen reported separately.  COMPARISON:  CT Abdomen and Pelvis 08/21/2014, 12/25/2008  FINDINGS: Normal lumbar segmentation demonstrated by CT. Stable vertebral height and alignment, with mild retrolisthesis at L5-S1. Lumbar, visible lower thoracic, and visible sacral bone marrow signal is within normal limits. No marrow edema or evidence of acute osseous abnormality.  Visualized lower thoracic spinal cord is normal with conus medularis at T12-L1. Normal cauda equina nerve roots. No abnormal intradural enhancement.  Stable visualized kidneys, abdominal aorta, and IVC. Other abdominal viscera today are reported with the abdomen. Negative visualized posterior paraspinal soft tissues.  No lumbar spinal stenosis. There is mild lumbar disc degeneration from L2-L3 to L5-S1. There is a posterior annular fissure at L3-L4. There is superimposed up to moderate lower lumbar facet degeneration, maximal at L4-L5. There is borderline to mild  lateral recess stenosis at L3-L4 and L4-L5. There is mild to moderate right L3 foraminal stenosis at L3-L4 (series 2, image 5).  IMPRESSION: 1. No metastatic disease or acute osseous abnormality identified in the lumbar spine. 2. Abdomen MRI from today reported separately. 3. Mild for age lumbar spine degeneration. Mild to moderate right L3 foraminal stenosis, and mild lateral recess stenosis at L3-L4 and L4-L5.   Electronically Signed   By: Genevie Ann M.D.   On: 08/26/2014 18:13   Mr Abdomen W Wo Contrast  08/27/2014   CLINICAL DATA:  Abnormal left lobe of liver on prior CT. Pancreatic cystic lesions. Diabetes. Hypertension. No primary malignancy history submitted.  EXAM: MRI ABDOMEN WITHOUT AND WITH CONTRAST  TECHNIQUE: Multiplanar multisequence MR imaging of the abdomen was performed both before and after the administration of intravenous contrast.  CONTRAST:  67mL MULTIHANCE GADOBENATE DIMEGLUMINE 529 MG/ML IV SOLN  COMPARISON:  CT of  08/21/2014 and 12/25/2008  FINDINGS: Lower chest: Normal heart size without pericardial or pleural effusion.  Hepatobiliary: Mild caudate lobe enlargement. Enhancing thrombus within an enlarged portal vein, and left greater than right branches. This begins in the porta hepatis, including on image 58 of series 18. Within the majority of the left lobe of the liver, heterogeneous delayed post-contrast enhancement is suspicious for infiltrative tumor. Example 6.3 x 8.2 cm on image 30 of series 17 and 5.8 x 7.1 cm on image 70 of series 18. No well-defined T2 abnormality seen. No right-sided mass identified. Status post cholecystectomy. Mild left sided intrahepatic ductal dilatation, most apparent in the lateral segment on image 12 of series 9.  The common duct is normal in caliber, without evidence of choledocholithiasis or obstructive mass.  Pancreas: Borderline main pancreatic duct dilatation with multiple cystic foci throughout the pancreas. The largest is within the head/uncinate process and measures 1.7 cm. No dominant or highly suspicious pancreatic mass seen. No abnormal enhancement within the cystic foci.  Spleen: Borderline splenomegaly, 14.2 cm craniocaudal. A hypoenhancing focus within the medial spleen measures 8 mm and is of doubtful clinical significance.  Adrenals/Urinary Tract: Normal adrenal glands.  Normal right kidney.  anterior interpolar left renal lesion measures 1.3 cm on image 24 of series 3. Demonstrates post-contrast enhancement, including on image 82 of series 16. No hydronephrosis.  Stomach/Bowel: Normal stomach and abdominal bowel loops.  Vascular/Lymphatic: Normal caliber of the aorta and branch vessels. Multiple right renal arteries. No splenic artery aneurysm. Recannulized paraumbilical vein. Portal vein thrombosis detailed above. Splenic vein and superior mesenteric vein both patent. No retroperitoneal or retrocrural adenopathy.  Other: No ascites.  Musculoskeletal: No acute osseous  abnormality.  IMPRESSION: 1. 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 2. Heterogeneous delayed enhancement within the left lobe of the liver. Concurrent mild caudate lobe enlargement. Findings are suspicious for infiltrative hepatocellular carcinoma. Given mild intrahepatic ductal dilatation, cholangiocarcinoma variant would be a secondary consideration. Consider focused ultrasound of the left hepatic lobe to allow possible ultrasound-guided sampling. 3. Enhancing left renal lesion, consistent with renal cell carcinoma. 4. Pancreatic findings which are favored to be the sequelae of chronic pancreatitis. No evidence of acute inflammation or dominant suspicious pancreatic mass. 5. Splenomegaly, likely related to the portal venous hypertension.   Electronically Signed   By: Abigail Miyamoto M.D.   On: 08/27/2014 08:48   Ct Abdomen Pelvis W Contrast  08/21/2014   CLINICAL DATA:  Lower abdominal pain for 2 weeks  EXAM: CT ABDOMEN AND PELVIS WITH  CONTRAST  TECHNIQUE: Multidetector CT imaging of the abdomen and pelvis was performed using the standard protocol following bolus administration of intravenous contrast.  CONTRAST:  159mL OMNIPAQUE IOHEXOL 300 MG/ML  SOLN  COMPARISON:  12/25/2008  FINDINGS: Lung bases are free of acute infiltrate or sizable effusion.  The gallbladder is been surgically removed. There is heterogeneity identified in the medial segment of the left lobe of the liver with extension inferiorly growing into the portal vein consistent with tumor thrombus. The tumor thrombus measures approximately 4.7 cm in greatest craniocaudad projection and appears to extend into both the left and right portal veins with bland appearing thrombus within the right portal vein as well as suggestion of bland thrombus within the main portal vein proximally. An large recanalized paraumbilical vein is noted although its enhancement pattern is decreased when compared with the  prior CT examination which could be related to tumor thrombus as well. Hypertrophy of the caudate lobe is noted.  The spleen is mildly prominent but stable from previous exam. The adrenal glands and pancreas are well visualized. There again noted cystic changes within the pancreas. Many of the cystic areas are stable from previous exam however in the head of the pancreas the dominant area now measures 2.6 cm. This could represent a cystic neoplasm within pancreas. Some calcifications are noted as well. Kidneys are well visualized bilaterally without renal calculi or obstructive changes. In the midportion of the left left kidney anteriorly there is a peripherally enhancing mass lesion measuring approximately 17 mm. This is best seen on image number 38 of series 2 and is confirmed on delayed imaging is best seen on image number 16 of series 7. This must be viewed with suspicion for an underlying renal cell carcinoma.  The appendix is not well visualized although no inflammatory changes are seen. There is some engorgement within the mesenteric venous structures consistent with the centrally predominately obstructing lesion within the main portal vein. The prostate is enlarged indenting upon the inferior aspect of the bladder. Postsurgical changes in the right hip are noted. No other acute bony abnormality is seen.  IMPRESSION: Diffuse heterogeneity within the medial segment of the left lobe of the liver with extension into the main portal vein as well as the right and left portal veins within the liver. Although this may represent a primary hepatic neoplasm the possibility of metastatic disease would deserve consideration as well. There also appears to be bland thrombus within the right portal venous branches as well as the main portal vein proximal to the nearly obstructing lesion at the bifurcation of the portal vein.  Enlarging cystic areas within the pancreas particularly in the head and uncinate process. The  possibility of a cystic neoplasm would deserve consideration and the changes in the liver may represent metastatic disease.  Peripherally enhancing mass lesion within the anterior aspect of the left kidney as described. Suspicion for underlying neoplasm is present.  MRI of the abdomen with and without contrast material is recommended for further evaluation of these abnormalities listed above.  These results were called by telephone at the time of interpretation on 08/21/2014 at 3:06 pm to Dr. Sherilyn Cooter , who verbally acknowledged these results.   Electronically Signed   By: Inez Catalina M.D.   On: 08/21/2014 15:19   US Biopsy  09/01/2014   CLINICAL DATA:  Portal venous enhancing thrombus, with suggestion of the left lobe infiltrative process on MR.  EXAM: ULTRASOUND-GUIDED CORE LIVER BIOPSY  TECHNIQUE: An ultrasound guided liver  biopsy was thoroughly discussed with the patient and questions were answered. The benefits, risks, alternatives, and complications were also discussed. The patient understands and wishes to proceed with the procedure. A verbal as well as written consent was obtained.  Survey ultrasound of the liver was performed, the portal venous thrombus and left lobe process identified, and an appropriate skin entry site was determined. Skin site was marked, prepped with Betadine, and draped in usual sterile fashion, and infiltrated locally with 1% lidocaine.  Intravenous Fentanyl and Versed were administered as conscious sedation during continuous cardiorespiratory monitoring by the radiology RN, with a total moderate sedation time of less than 30 minutes.  Under ultrasound guidance, a 17 gauge trocar needle was advanced to the margin of the lesion. Once needle tip position was confirmed, coaxial 18-gauge core biopsy samples were obtained, submitted in formalin to surgical pathology. The guide needle was removed. The patient tolerated the procedure well.  COMPLICATIONS: COMPLICATIONS None immediate   FINDINGS: The portal vein and its visualized dominant intrahepatic branches are distended with echogenic material. A patent coronary vein is identified. The left lobe is small and heterogeneous. Represented core biopsy samples were obtained.  IMPRESSION: 1. Technically successful ultrasound guided core left lobe liver biopsy.   Electronically Signed   By: Lucrezia Europe M.D.   On: 09/01/2014 15:55   09/01/14 - Liver biopsy Pathology Report - DIAGNOSIS:  A. LIVER MASS, LEFT LOBE; ULTRASOUND GUIDED BIOPSY:  HEPATOCELLULAR CARCINOMA, GRADE 3 (EDMONDSON AND STEINER CLASSIFICATION).  BACKGROUND LIVER WITH CHANGES CONSISTENT WITH CIRRHOSIS.    ASSESSMENT / PLAN:   1. Newly diagnosed hepatocellular carcinoma on liver biopsy done 09/01/2014, at least clinical stage III given large liver lesion and portal vein involvement.    09/01/14 - Liver biopsy Pathology Report - DIAGNOSIS:  A. LIVER MASS, LEFT LOBE; ULTRASOUND GUIDED BIOPSY:  HEPATOCELLULAR CARCINOMA, GRADE 3 (EDMONDSON AND STEINER CLASSIFICATION).  BACKGROUND LIVER WITH  CHANGES CONSISTENT WITH CIRRHOSIS.   (Presented with Infiltrative large liver lesion, left lobe seen on both CT scan and MRI of the liver suspicious for primary liver malignancy versus  metastatic disease. CT scan also shows tumor thrombus extending into portal vein).  08/22/14 - serum CEA (2.7) and AFP (4.6) are normal. CA-19-9 minimally elevated at 48.0.  -  Reviewed liver biopsy report and discussed with patient and family are present in detail, have explained based upon size of the liver abnormality on scans and portal vein involvement, it is at least stage III hepatocellular carcinoma, and given this it is unresectable. Have also explained that advanced liver cancer is overall incurable disease, and treatments offered with palliative intent only, and the overall prognosis is poor especially if it does not respond to treatment. Have discussed with interventional radiologist Dr. Kathlene Cote regarding  any feasible local therapies, he feels that TACE (transarterial chemoembolization) might be one option even with portal vein involvement, and would like to evaluate the patient. We will make referral to see him. Have also discussed other options of systemic targeted therapy with oral agent like Nexavar. Patient wants to meet with interventional radiologist first and then make further decision. For better pain control, since he has difficulty tolerating when necessary medication, will start on low-dose fentanyl patch 12 g per hour. Have advised patient about possible side effects from this including constipation and also to avoid operating motor vehicle or machinery while on medication, and to notify us if he cannot tolerate this. Will see him back on August 24 for continued follow-up.  2. Left kidney mass measuring 17 mm on CT scan. MRI abdomen also reports suspicion for kidney cancer - seen by urologist Dr. Erlene Quan and recommended observation for now given diagnosis of large advanced liver cancer.  Patient denies urinary symptoms including hematuria.   3. Cystic abnormality in pancreas  - ? Sequelae of chronic pancreatitis versus other etiology - currently no abdominal pain or other GI symptoms.  4. Iron deficiency diagnosed on labs of 08/22/14, hemoglobin normal at 14.0 - patient was referred to GI Dr. Vira Agar since last colonoscopy was many years ago, and has history of ulcerative colitis. Patient advised to increase oral iron to 3 times per day if tolerated. Patient wants to hold off on GI appointment and deal with liver cancer issue at this time.   5. Chronic low back pain - MRI lumbar spine negative for lytic lesions are metastasis, reports degenerative changes and spinal stenosis. Patient does not want see a spine specialist at this time, pain medication being added as described above.      6. In between visits, the patient has been advised to call or come to the ER in case of worsening pain, acute  sickness or new symptoms. Patient is agreeable to this plan.    Leia Alf, MD   09/15/2014 9:36 AM

## 2014-09-16 ENCOUNTER — Other Ambulatory Visit: Payer: Self-pay | Admitting: Internal Medicine

## 2014-09-16 ENCOUNTER — Other Ambulatory Visit: Payer: Medicare Other

## 2014-09-16 ENCOUNTER — Encounter (INDEPENDENT_AMBULATORY_CARE_PROVIDER_SITE_OTHER): Payer: Medicare Other | Admitting: Ophthalmology

## 2014-09-16 DIAGNOSIS — H34811 Central retinal vein occlusion, right eye: Secondary | ICD-10-CM

## 2014-09-16 DIAGNOSIS — D3132 Benign neoplasm of left choroid: Secondary | ICD-10-CM | POA: Diagnosis not present

## 2014-09-16 DIAGNOSIS — E11319 Type 2 diabetes mellitus with unspecified diabetic retinopathy without macular edema: Secondary | ICD-10-CM | POA: Diagnosis not present

## 2014-09-16 DIAGNOSIS — I10 Essential (primary) hypertension: Secondary | ICD-10-CM

## 2014-09-16 DIAGNOSIS — H43813 Vitreous degeneration, bilateral: Secondary | ICD-10-CM | POA: Diagnosis not present

## 2014-09-16 DIAGNOSIS — H35033 Hypertensive retinopathy, bilateral: Secondary | ICD-10-CM

## 2014-09-16 DIAGNOSIS — C220 Liver cell carcinoma: Secondary | ICD-10-CM

## 2014-09-16 DIAGNOSIS — E11329 Type 2 diabetes mellitus with mild nonproliferative diabetic retinopathy without macular edema: Secondary | ICD-10-CM

## 2014-09-16 NOTE — Telephone Encounter (Signed)
Rcvd call from [facility] that pt has appt 8/16 with Dr. Valla Leaver be there at 12:15 and see md 12:30. Dr. Laural Roes unavailable until 8/24 and that was too far out for pt to wait per md.  dierections [place]  They can go [address] and the building name is [employer].  The bld does have [address] at the top of it.  The wife was contacted and gave date and directions and she feels like she knows exactly where it is

## 2014-09-18 ENCOUNTER — Ambulatory Visit
Admission: RE | Admit: 2014-09-18 | Discharge: 2014-09-18 | Disposition: A | Discharge status: Home / Self Care | Payer: Medicare Other | Source: Ambulatory Visit | Attending: Internal Medicine | Admitting: Internal Medicine

## 2014-09-18 DIAGNOSIS — C220 Liver cell carcinoma: Secondary | ICD-10-CM | POA: Insufficient documentation

## 2014-09-18 NOTE — Consult Note (Signed)
Chief Complaint: Patient was seen in consultation today for No chief complaint on file.  at the request of Pandit,Sandeep  Referring Physician(s): Pandit,Sandeep  History of Present Illness: Angel Franklin is a 74 y.o. male with a newly diagnosed infiltrative hepatocellular carcinoma to discuss the possibility of liver directed intra-arterial therapy.  His wife is with him today.  His initial symptoms were lower abdominal pain. Currently, he describes significant fatigue and decreased appetite. He suspects he is losing a small amount of weight. Additionally, he has felt depressed and hopeless since his recent diagnosis.  He denies current abdominal pain, nausea, vomiting, fever or chills.  MRI demonstrates infiltrative tumor throughout the left lobe of the liver involving both the medial and lateral segments as well as extensive invasion of the portal vein affecting the left intrahepatic, left main, right main and main portal veins. There is very limited flow within the nearly occluded main portal vein. No significant cavernous transformation.  Ultrasound-guided biopsy was performed on 09/01/2014 which confirmed hepatocellular carcinoma.   Past Medical History  Diagnosis Date  . Diabetes mellitus without complication   . Hypertension   . Hyperlipidemia   . Gout   . BPH (benign prostatic hypertrophy)   . PUD (peptic ulcer disease)   . Pancreatitis, chronic   . Ulcerative colitis   . Heart disease     Past Surgical History  Procedure Laterality Date  . Appendectomy    . Coronary artery bypass graft      1995  . Total hip arthroplasty Right 06/21/13  . Cholecystectomy      Allergies: Propofol  Medications: Prior to Admission medications   Medication Sig Start Date End Date Taking? Authorizing Provider  acetaminophen (TYLENOL) 500 MG tablet Take 1,000 mg by mouth every 6 (six) hours as needed.   Yes Historical Provider, MD  allopurinol (ZYLOPRIM) 300 MG tablet  Take 300 mg by mouth daily.   Yes Historical Provider, MD  aspirin EC 81 MG tablet Take 81 mg by mouth daily.   Yes Historical Provider, MD  atenolol (TENORMIN) 25 MG tablet Take by mouth daily.   Yes Historical Provider, MD  ferrous sulfate 325 (65 FE) MG tablet Take 325 mg by mouth daily with breakfast.   Yes Historical Provider, MD  insulin aspart (NOVOLOG) 100 UNIT/ML injection Inject 10 Units into the skin 3 (three) times daily before meals.   Yes Historical Provider, MD  insulin glargine (LANTUS) 100 UNIT/ML injection Inject 30 Units into the skin at bedtime.   Yes Historical Provider, MD  loperamide (IMODIUM) 2 MG capsule Take by mouth as needed for diarrhea or loose stools.   Yes Historical Provider, MD  mesalamine (ASACOL) 400 MG EC tablet Take 400 mg by mouth 3 (three) times daily. 2 tablets by mouth three times a day   Yes Historical Provider, MD  Multiple Vitamin (MULTIVITAMIN) tablet Take 1 tablet by mouth daily.   Yes Historical Provider, MD  pravastatin (PRAVACHOL) 40 MG tablet Take 40 mg by mouth daily.   Yes Historical Provider, MD  predniSONE (DELTASONE) 20 MG tablet  06/09/14  Yes Historical Provider, MD  traMADol (ULTRAM) 50 MG tablet Take one-half tablet once every 6 hours as needed for pain 09/12/14  Yes Leia Alf, MD  vitamin B-12 (CYANOCOBALAMIN) 250 MCG tablet Take 250 mcg by mouth daily.   Yes Historical Provider, MD  fentaNYL (DURAGESIC - DOSED MCG/HR) 12 MCG/HR Place 1 patch (12.5 mcg total) onto the skin every 3 (three)  days. Patient not taking: Reported on 09/18/2014 09/12/14   Leia Alf, MD     Family History  Problem Relation Age of Onset  . Prostate cancer Father   . Heart attack Father     Social History   Social History  . Marital Status: Married    Spouse Name: N/A  . Number of Children: N/A  . Years of Education: N/A   Social History Main Topics  . Smoking status: Former Smoker    Quit date: 06/07/1993  . Smokeless tobacco: Former Systems developer     Quit date: 06/07/1993  . Alcohol Use: No     Comment: quit drinking in Nov 1975  . Drug Use: No  . Sexual Activity: Yes    Birth Control/ Protection: Coitus interruptus   Other Topics Concern  . Not on file   Social History Narrative    ECOG Status: 2 - Symptomatic, <50% confined to bed  Review of Systems: A 12 point ROS discussed and pertinent positives are indicated in the HPI above.  All other systems are negative.  Review of Systems  Vital Signs: BP 142/7 mmHg  Pulse 71  Temp(Src) 98.6 F (37 C) (Oral)  Resp 15  Ht 6\' 2"  (1.88 m)  Wt 190 lb 3.2 oz (86.274 kg)  BMI 24.41 kg/m2  SpO2 100%  Physical Exam  Constitutional: He is oriented to person, place, and time. He appears well-developed and well-nourished. No distress.  HENT:  Head: Normocephalic and atraumatic.  Eyes: No scleral icterus.  Cardiovascular: Normal rate.   Pulmonary/Chest: Effort normal.  Neurological: He is alert and oriented to person, place, and time.  Skin: Skin is warm and dry.  Psychiatric: He has a normal mood and affect. His behavior is normal.  Nursing note and vitals reviewed.   Imaging:   Labs:  CBC:  Recent Labs  08/22/14 1252  WBC 6.9  HGB 14.0  HCT 43.2  42.0  PLT 95*    COAGS:  Recent Labs  08/22/14 1252  INR 1.13  APTT 31    BMP:  Recent Labs  08/22/14 1252  NA 138  K 3.6  CL 101  CO2 25  GLUCOSE 169*  BUN 17  CALCIUM 8.7*  CREATININE 1.20  GFRNONAA 57*  GFRAA >60    LIVER FUNCTION TESTS:  Recent Labs  08/22/14 1252  BILITOT 1.1  AST 65*  ALT 86*  ALKPHOS 153*  PROT 7.3  ALBUMIN 3.9    TUMOR MARKERS:  Recent Labs  08/22/14 1252  AFPTM 4.6  CEA 2.7  CA199 48*    Assessment and Plan:  Angel Franklin is an unfortunate 74 year old gentleman with cryptogenic cirrhosis and a new diagnosis of infiltrative hepatocellular carcinoma (biopsy-proven) with extensive tumor thrombus completely obliterating the left intrahepatic and left  main portal veins with extension into the right main portal vein and main portal vein. 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    Unfortunately, in the setting of an infiltrative tumor and extensive portal vein tumor thrombus, this very nice gentleman is at relatively high risk for intra-arterial liver directed therapies. Diminished portal venous blood flow, embolization of the arterial supply can result in intrahepatic abscess, and biliary necrosis with biloma formation as well as overt hepatic decompensation.  Furthermore, infiltrative tumor patterns tend to respond less well anecdotally than more well-defined hepatomas.  The least embolic liver directed therapy is transarterial radioembolization with Y90 glass microspheres.   I discussed the risks, benefits and alternatives in detail  with both Mr. Crisci and his wife. Given the relatively higher risk and lower benefit ratio for his particular setting, they and I are in agreement that palliative oral chemotherapy with sorafenib (Nexavar) is likely his best option.    I would like to keep abreast of his follow-up imaging. If he demonstrates a response to Nexavar over the coming months with either decreased portal venous invasion or increased collateralization, he may become a candidate for transarterial radioembolization.  1.) There is a small volume of bland thrombus as well as tumor thrombus in the main portal vein. Recommend at least daily aspirin therapy if not low-dose anticoagulation to prevent further portal vein thrombosis.  2.) Assessment and recommendations discussed with Dr. Ma Hillock over the telephone.  3.) I will have my office staff set a reminder for [place] to review his follow-up imaging at 3 and 6 months to assess possible future candidacy for Y90.    Thank you for this interesting consult.  I greatly enjoyed meeting Angel Franklin and look forward to participating in their care.  A copy of this report was sent to the  requesting provider on this date.  SignedJacqulynn Cadet 09/18/2014, 12:29 PM   I spent a total of  30 Minutes   in face to face in clinical consultation, greater than 50% of which was counseling/coordinating care for [facility].

## 2014-09-19 ENCOUNTER — Telehealth: Payer: Self-pay | Admitting: *Deleted

## 2014-09-19 ENCOUNTER — Other Ambulatory Visit: Payer: Self-pay | Admitting: Internal Medicine

## 2014-09-19 DIAGNOSIS — C220 Liver cell carcinoma: Secondary | ICD-10-CM

## 2014-09-19 NOTE — Progress Notes (Signed)
Per d/w radiologist Dr.H.McCollough who saw patient, he is not a candidate for liver-directed therapy like TACE or yttrium and recommnends pursuing systemic therapy. Plan is to start on oral Nexavar (Sorafenib) 400 mg PO BID and monitor labs and for side effects. Patient and family have already been explained upon his recent visit here about palliative intent of treatment, possible side effects and response rates. Have again discussed over the phone today and they are agreeable to pursue Nexavar therapy.  Also, radiologist felt that there might be some bland thrombus in the Portal veins (in addition to tumor thrombus) and recommended considering anticoagulation. Have also discussed this over the phone with patient/wife, they only want to take Aspirin and do not want to pursue other anticoagulants like Warfarin or oral Xa-inhibitors like Eliquis or Xarelto.

## 2014-09-19 NOTE — Telephone Encounter (Signed)
Called wife back and both of know that the procedure can't be done and that the next rec. Was to start nexavar and that I am filling out paper and sending rx to speciality pharmacy and will let her know when I get a response. It sometimes take7-10 days and I will need to make appt to teach them about med. Wife agreeable.

## 2014-09-19 NOTE — Telephone Encounter (Signed)
asking Angel Franklin call her regarding the radiology procedure he had done yesterday

## 2014-09-22 ENCOUNTER — Telehealth: Payer: Self-pay | Admitting: *Deleted

## 2014-09-22 ENCOUNTER — Encounter: Payer: Self-pay | Admitting: Internal Medicine

## 2014-09-22 NOTE — Telephone Encounter (Signed)
They are working on a PA for this patients Nexavaar

## 2014-09-23 ENCOUNTER — Encounter: Payer: Self-pay | Admitting: Internal Medicine

## 2014-09-23 ENCOUNTER — Ambulatory Visit
Admission: RE | Admit: 2014-09-23 | Discharge: 2014-09-23 | Disposition: A | Discharge status: Home / Self Care | Payer: Medicare Other | Source: Ambulatory Visit | Attending: Internal Medicine | Admitting: Internal Medicine

## 2014-09-23 DIAGNOSIS — C220 Liver cell carcinoma: Secondary | ICD-10-CM | POA: Diagnosis present

## 2014-09-29 ENCOUNTER — Ambulatory Visit: Admit: 2014-09-29 | Payer: Self-pay | Admitting: Unknown Physician Specialty

## 2014-09-29 SURGERY — COLONOSCOPY WITH PROPOFOL
Anesthesia: General

## 2014-10-01 ENCOUNTER — Inpatient Hospital Stay (HOSPITAL_BASED_OUTPATIENT_CLINIC_OR_DEPARTMENT_OTHER): Payer: Medicare Other | Admitting: Internal Medicine

## 2014-10-01 VITALS — BP 160/93 | HR 96 | Temp 95.9°F | Resp 18 | Ht 74.0 in | Wt 188.3 lb

## 2014-10-01 DIAGNOSIS — C220 Liver cell carcinoma: Secondary | ICD-10-CM | POA: Diagnosis not present

## 2014-10-01 DIAGNOSIS — K869 Disease of pancreas, unspecified: Secondary | ICD-10-CM

## 2014-10-01 DIAGNOSIS — R42 Dizziness and giddiness: Secondary | ICD-10-CM

## 2014-10-01 DIAGNOSIS — I10 Essential (primary) hypertension: Secondary | ICD-10-CM

## 2014-10-01 DIAGNOSIS — N2889 Other specified disorders of kidney and ureter: Secondary | ICD-10-CM | POA: Diagnosis not present

## 2014-10-01 DIAGNOSIS — N400 Enlarged prostate without lower urinary tract symptoms: Secondary | ICD-10-CM

## 2014-10-01 DIAGNOSIS — K861 Other chronic pancreatitis: Secondary | ICD-10-CM

## 2014-10-01 DIAGNOSIS — I81 Portal vein thrombosis: Secondary | ICD-10-CM

## 2014-10-01 DIAGNOSIS — Z7982 Long term (current) use of aspirin: Secondary | ICD-10-CM

## 2014-10-01 DIAGNOSIS — M545 Low back pain: Secondary | ICD-10-CM

## 2014-10-01 DIAGNOSIS — Z87891 Personal history of nicotine dependence: Secondary | ICD-10-CM

## 2014-10-01 DIAGNOSIS — Z79899 Other long term (current) drug therapy: Secondary | ICD-10-CM

## 2014-10-01 DIAGNOSIS — E119 Type 2 diabetes mellitus without complications: Secondary | ICD-10-CM

## 2014-10-01 DIAGNOSIS — R1011 Right upper quadrant pain: Secondary | ICD-10-CM

## 2014-10-01 DIAGNOSIS — Z8639 Personal history of other endocrine, nutritional and metabolic disease: Secondary | ICD-10-CM

## 2014-10-01 DIAGNOSIS — E611 Iron deficiency: Secondary | ICD-10-CM

## 2014-10-01 DIAGNOSIS — M5136 Other intervertebral disc degeneration, lumbar region: Secondary | ICD-10-CM

## 2014-10-01 DIAGNOSIS — R5383 Other fatigue: Secondary | ICD-10-CM

## 2014-10-01 DIAGNOSIS — Z9049 Acquired absence of other specified parts of digestive tract: Secondary | ICD-10-CM

## 2014-10-01 DIAGNOSIS — E785 Hyperlipidemia, unspecified: Secondary | ICD-10-CM

## 2014-10-01 DIAGNOSIS — Z794 Long term (current) use of insulin: Secondary | ICD-10-CM

## 2014-10-01 MED ORDER — MEGESTROL ACETATE 400 MG/10ML PO SUSP: 400.0000 mg | Freq: Every day | ORAL | Status: AC

## 2014-10-01 MED ORDER — AMOXICILLIN-POT CLAVULANATE 500-125 MG PO TABS: 1.0000 | ORAL_TABLET | Freq: Two times a day (BID) | ORAL | Status: AC

## 2014-10-02 ENCOUNTER — Telehealth: Payer: Self-pay | Admitting: *Deleted

## 2014-10-02 ENCOUNTER — Other Ambulatory Visit: Payer: Self-pay | Admitting: Internal Medicine

## 2014-10-02 DIAGNOSIS — C220 Liver cell carcinoma: Secondary | ICD-10-CM

## 2014-10-02 MED ORDER — ONDANSETRON HCL 4 MG PO TABS
4.0000 mg | ORAL_TABLET | ORAL | Status: AC | PRN
Start: 2014-10-02 — End: ?

## 2014-10-02 NOTE — Telephone Encounter (Signed)
Had refused nausea med yesterday, but had a bad bout of nauesea last night and would like something called in for it to [place]

## 2014-10-02 NOTE — Telephone Encounter (Signed)
Informed wife that rx has been sent to pharmacy

## 2014-10-02 NOTE — Telephone Encounter (Signed)
Have Escribed Ondansetron 4 mg PO q4 hours prn, please let them know. Thanks.

## 2014-10-07 ENCOUNTER — Other Ambulatory Visit: Payer: Self-pay | Admitting: Internal Medicine

## 2014-10-07 DIAGNOSIS — R51 Headache: Secondary | ICD-10-CM

## 2014-10-07 DIAGNOSIS — R42 Dizziness and giddiness: Secondary | ICD-10-CM

## 2014-10-07 DIAGNOSIS — R519 Headache, unspecified: Secondary | ICD-10-CM

## 2014-10-10 ENCOUNTER — Ambulatory Visit
Admission: RE | Admit: 2014-10-10 | Discharge: 2014-10-10 | Disposition: A | Discharge status: Home / Self Care | Payer: Medicare Other | Source: Ambulatory Visit | Attending: Internal Medicine | Admitting: Internal Medicine

## 2014-10-10 DIAGNOSIS — R51 Headache: Secondary | ICD-10-CM

## 2014-10-10 DIAGNOSIS — R519 Headache, unspecified: Secondary | ICD-10-CM

## 2014-10-10 DIAGNOSIS — R42 Dizziness and giddiness: Secondary | ICD-10-CM

## 2014-10-10 DIAGNOSIS — I679 Cerebrovascular disease, unspecified: Secondary | ICD-10-CM | POA: Diagnosis not present

## 2014-10-10 DIAGNOSIS — G3189 Other specified degenerative diseases of nervous system: Secondary | ICD-10-CM | POA: Insufficient documentation

## 2014-10-18 NOTE — Progress Notes (Signed)
[place]  Telephone:([phone]) [phone] Fax:(336) [phone]     ID: DRAKO MAESE OB: Mar 06, 1940  MR#: [id number]  WVP#:[id number]  Patient Care Team: Idelle Crouch, MD as PCP - General (Unknown Physician Specialty) Clent Jacks, RN as Registered Nurse  CHIEF COMPLAINT/DIAGNOSIS:  1. Newly diagnosed hepatocellular carcinoma on liver US-biopsy done 09/01/14, at least clinical stage III given large liver lesion and portal vein involvement.     09/01/14 - Liver biopsy Pathology Report - DIAGNOSIS:  A. LIVER MASS, LEFT LOBE; ULTRASOUND GUIDED BIOPSY:      HEPATOCELLULAR CARCINOMA, GRADE 3 (EDMONDSON AND STEINER CLASSIFICATION).  BACKGROUND LIVER WITH     CHANGES CONSISTENT WITH CIRRHOSIS.  (Presented with Infiltrative large liver lesion, left lobe seen on both CT scan and MRI of the liver suspicious for primary liver malignancy versus  metastatic disease. CT scan also shows tumor thrombus extending into portal vein).  08/22/14 - serum CEA (2.7) and AFP (4.6) are normal. CA-19-9 minimally elevated at 48.0. Evaluated by interventional radiology and deemed not a candidate for local therapies like TACE. Here to start Nexavar systemic therapy.  2. Left kidney mass measuring 17 mm on CT scan. MRI abdomen also reports suspicion for kidney cancer - seen by urologist Dr. Erlene Quan and recommended observation for now given diagnosis of large advanced liver cancer.   3. Cystic abnormality in pancreas  - ? Sequelae of chronic pancreatitis versus other etiology.  4. Iron deficiency diagnosed on labs of 08/22/14, hemoglobin normal at 14.0.   HISTORY OF PRESENT ILLNESS:  Patient returns for continued oncology follow-up, overall states that he is doing about the same. He has chronic fatigue on exertion and weakness, tries to stay physically active. He has intermittent right upper quadrant pain, fluctuates 0-8/10.  States that low back pain was much better after starting taking tramadol when  necessary, but takes one half tablet along side effects. Eating steady. No bright red blood in stools, melena or hematuria. No fever or chills.Marland Kitchen  REVIEW OF SYSTEMS:   ROS  As in HPI above. In addition, no new headaches or focal weakness.  No sore throat or dysphagia. Denies cough, shortness of breath, sputum, hemoptysis or chest pain. No dysuria or hematuria. No new skin rash or bleeding symptoms. No new paresthesias in extremities.  PS ECOG 1  PAST MEDICAL HISTORY: Reviewed. Past Medical History  Diagnosis Date  . Diabetes mellitus without complication   . Hypertension   . Hyperlipidemia   . Gout   . BPH (benign prostatic hypertrophy)   . PUD (peptic ulcer disease)   . Pancreatitis, chronic   . Ulcerative colitis   . Heart disease     PAST SURGICAL HISTORY: Reviewed. Past Surgical History  Procedure Laterality Date  . Appendectomy    . Coronary artery bypass graft      1995  . Total hip arthroplasty Right 06/21/13  . Cholecystectomy      FAMILY HISTORY: Reviewed. Father had prostate cancer , denies other maligancy.  SOCIAL HISTORY: Reviewed. Social History  Substance Use Topics  . Smoking status: Former Smoker    Quit date: 06/07/1993  . Smokeless tobacco: Former Systems developer    Quit date: 06/07/1993  . Alcohol Use: No     Comment: quit drinking in Nov 1975    Allergies  Allergen Reactions  . Propofol Other (See Comments)    Makes him very sick.    Current Outpatient Prescriptions  Medication Sig Dispense Refill  . acetaminophen (TYLENOL) 500  MG tablet Take 1,000 mg by mouth every 6 (six) hours as needed.    Marland Kitchen allopurinol (ZYLOPRIM) 300 MG tablet Take 300 mg by mouth daily.    Marland Kitchen aspirin EC 81 MG tablet Take 81 mg by mouth daily.    Marland Kitchen atenolol (TENORMIN) 25 MG tablet Take by mouth daily.    . ferrous sulfate 325 (65 FE) MG tablet Take 325 mg by mouth daily with breakfast.    . insulin aspart (NOVOLOG) 100 UNIT/ML injection Inject 10 Units into the skin 3 (three) times  daily before meals.    . insulin glargine (LANTUS) 100 UNIT/ML injection Inject 30 Units into the skin at bedtime.    Marland Kitchen loperamide (IMODIUM) 2 MG capsule Take by mouth as needed for diarrhea or loose stools.    . mesalamine (ASACOL) 400 MG EC tablet Take 400 mg by mouth 3 (three) times daily. 2 tablets by mouth three times a day    . Multiple Vitamin (MULTIVITAMIN) tablet Take 1 tablet by mouth daily.    . pravastatin (PRAVACHOL) 40 MG tablet Take 40 mg by mouth daily.    . predniSONE (DELTASONE) 5 MG tablet Take 5 mg by mouth daily with breakfast.    . SORAfenib (NEXAVAR) 200 MG tablet Take 400 mg by mouth 2 (two) times daily. Give on an empty stomach 1 hour before or 2 hours after meals.    . traMADol (ULTRAM) 50 MG tablet Take one-half tablet once every 6 hours as needed for pain 60 tablet 0  . vitamin B-12 (CYANOCOBALAMIN) 250 MCG tablet Take 250 mcg by mouth daily.    Marland Kitchen amoxicillin-clavulanate (AUGMENTIN) 500-125 MG per tablet Take 1 tablet (500 mg total) by mouth 2 (two) times daily. 12 tablet 0  . fentaNYL (DURAGESIC - DOSED MCG/HR) 12 MCG/HR Place 1 patch (12.5 mcg total) onto the skin every 3 (three) days. (Patient not taking: Reported on 10/01/2014) 10 patch 0  . megestrol (MEGACE) 400 MG/10ML suspension Take 10 mLs (400 mg total) by mouth daily. 480 mL 1  . ondansetron (ZOFRAN) 4 MG tablet Take 1 tablet (4 mg total) by mouth every 4 (four) hours as needed for nausea or vomiting. 60 tablet 2  . predniSONE (DELTASONE) 20 MG tablet      No current facility-administered medications for this visit.    PHYSICAL EXAM: Filed Vitals:   10/01/14 1414  BP: 160/93  Pulse:   Temp:   Resp:      Body mass index is 24.16 kg/(m^2).    ECOG FS:1 - Symptomatic but completely ambulatory  GENERAL: Patient is alert and oriented and in no acute distress. No icterus. HEENT: EOMs intact. No oral thrush. CVS: S1S2, regular LUNGS: Bilaterally clear to auscultation, no rhonchi. ABDOMEN: Soft,  nontender. Liver is palpable.  EXTREMITIES: No pedal edema. NEURO: Cranial nerves intact, grossly nonfocal  LAB RESULTS: 08/20/2014 - hemoglobin 13.5, WBC 7-100, platelets 115, ANC 4870, MCV 91.1.     08/22/2014 - serum CEA (2.7) and AFP (4.6) are normal. CA-19-9 minimally elevated at 48.0.  STUDIES: 09/01/14 - Liver biopsy Pathology Report - DIAGNOSIS:  A. LIVER MASS, LEFT LOBE; ULTRASOUND GUIDED BIOPSY:  HEPATOCELLULAR CARCINOMA, GRADE 3 (EDMONDSON AND STEINER CLASSIFICATION).  BACKGROUND LIVER WITH CHANGES CONSISTENT WITH CIRRHOSIS.    ASSESSMENT / PLAN:   1. Newly diagnosed hepatocellular carcinoma on liver biopsy done 09/01/2014, at least clinical stage III given large liver lesion and portal vein involvement.    09/01/14 - Liver biopsy Pathology Report -  DIAGNOSIS:  A. LIVER MASS, LEFT LOBE; ULTRASOUND GUIDED BIOPSY:  HEPATOCELLULAR CARCINOMA, GRADE 3 (EDMONDSON AND STEINER CLASSIFICATION).  BACKGROUND LIVER WITH  CHANGES CONSISTENT WITH CIRRHOSIS.   (Presented with Infiltrative large liver lesion, left lobe seen on both CT scan and MRI of the liver suspicious for primary liver malignancy versus  metastatic disease. CT scan also shows tumor thrombus extending into portal vein).  08/22/14 - serum CEA (2.7) and AFP (4.6) are normal. CA-19-9 minimally elevated at 48.0. Evaluated by interventional radiology and deemed not a candidate for local therapies like TACE. Here to start Nexavar systemic therapy  -   patient is now having right upper quadrant abdominal pain issues, plan is to start on low-dose fentanyl patch 12 g per hour, he does not want to take stronger pain medication. However advised about possible side effects off opiate pain medication including constipation and to take stool softener/accident as indicated. Also advised him not to operate a motor vehicle or machinery while on medication. Also discussed systemic treatment options for hepatocellular cancer, patient is interested in  pursuing treatment. Will start on targeted therapy with Nexavar 400 mg by mouth twice a day, patient explained about palliative intent of treatment, possible response rates and possible side effects. He is agreeable to pursue this treatment and expressed verbal consent. Will get lab in 3 weeks, next M.D. follow-up in 6 weeks with repeat labs.   2. Left kidney mass measuring 17 mm on CT scan. MRI abdomen also reports suspicion for kidney cancer - seen by urologist Dr. Erlene Quan and recommended observation for now given diagnosis of large advanced liver cancer.  Patient denies urinary symptoms including hematuria.   3. Cystic abnormality in pancreas  - ? Sequelae of chronic pancreatitis versus other etiology - currently no abdominal pain or other GI symptoms.  4. Iron deficiency diagnosed on labs of 08/22/14, hemoglobin normal at 14.0 - patient was referred to GI Dr. Vira Agar since last colonoscopy was many years ago, and has history of ulcerative colitis. Patient advised to increase oral iron to 3 times per day if tolerated. Patient wants to hold off on GI appointment and deal with liver cancer issue at this time.   5. Chronic low back pain - MRI lumbar spine negative for lytic lesions are metastasis, reports degenerative changes and spinal stenosis. Patient does not want see a spine specialist at this time, pain medication being added as described above.      6. Referred to ENT for vertigo.      7. In between visits, the patient has been advised to call or come to the ER in case of worsening pain, acute sickness or new symptoms. Patient is agreeable to this plan.    Leia Alf, MD   10/18/2014 3:34 AM

## 2014-10-22 ENCOUNTER — Inpatient Hospital Stay: Payer: Medicare Other | Attending: Internal Medicine

## 2014-10-22 DIAGNOSIS — C220 Liver cell carcinoma: Secondary | ICD-10-CM

## 2014-10-22 DIAGNOSIS — C228 Malignant neoplasm of liver, primary, unspecified as to type: Secondary | ICD-10-CM | POA: Diagnosis not present

## 2014-10-22 LAB — HEPATIC FUNCTION PANEL
ALK PHOS: 327 U/L — AB (ref 38–126)
ALT: 103 U/L — ABNORMAL HIGH (ref 17–63)
AST: 101 U/L — ABNORMAL HIGH (ref 15–41)
Albumin: 2.8 g/dL — ABNORMAL LOW (ref 3.5–5.0)
BILIRUBIN INDIRECT: 1.8 mg/dL — AB (ref 0.3–0.9)
Bilirubin, Direct: 1.4 mg/dL — ABNORMAL HIGH (ref 0.1–0.5)
TOTAL PROTEIN: 6 g/dL — AB (ref 6.5–8.1)
Total Bilirubin: 3.2 mg/dL — ABNORMAL HIGH (ref 0.3–1.2)

## 2014-10-22 LAB — MAGNESIUM: MAGNESIUM: 2 mg/dL (ref 1.7–2.4)

## 2014-10-22 LAB — BASIC METABOLIC PANEL
Anion gap: 12 (ref 5–15)
BUN: 29 mg/dL — AB (ref 6–20)
CALCIUM: 8.5 mg/dL — AB (ref 8.9–10.3)
CHLORIDE: 101 mmol/L (ref 101–111)
CO2: 23 mmol/L (ref 22–32)
CREATININE: 1.6 mg/dL — AB (ref 0.61–1.24)
GFR, EST AFRICAN AMERICAN: 47 mL/min — AB (ref >60)
GFR, EST NON AFRICAN AMERICAN: 40 mL/min — AB (ref >60)
Glucose, Bld: 101 mg/dL — ABNORMAL HIGH (ref 65–99)
Potassium: 4.2 mmol/L (ref 3.5–5.1)
SODIUM: 136 mmol/L (ref 135–145)

## 2014-10-22 LAB — CBC WITH DIFFERENTIAL/PLATELET
BASOS PCT: 1 %
Basophils Absolute: 0.1 10*3/uL (ref 0–0.1)
EOS ABS: 0.1 10*3/uL (ref 0–0.7)
EOS PCT: 1 %
HCT: 50.4 % (ref 40.0–52.0)
Hemoglobin: 16.5 g/dL (ref 13.0–18.0)
LYMPHS ABS: 2.5 10*3/uL (ref 1.0–3.6)
Lymphocytes Relative: 31 %
MCH: 29.1 pg (ref 26.0–34.0)
MCHC: 32.7 g/dL (ref 32.0–36.0)
MCV: 89.1 fL (ref 80.0–100.0)
MONOS PCT: 5 %
Monocytes Absolute: 0.4 10*3/uL (ref 0.2–1.0)
Neutro Abs: 5.1 10*3/uL (ref 1.4–6.5)
Neutrophils Relative %: 62 %
PLATELETS: 110 10*3/uL — AB (ref 150–440)
RBC: 5.66 MIL/uL (ref 4.40–5.90)
RDW: 20.7 % — AB (ref 11.5–14.5)
WBC: 8.1 10*3/uL (ref 3.8–10.6)

## 2014-10-22 LAB — PHOSPHORUS: PHOSPHORUS: 3 mg/dL (ref 2.5–4.6)

## 2014-10-23 ENCOUNTER — Telehealth: Payer: Self-pay | Admitting: *Deleted

## 2014-10-23 ENCOUNTER — Other Ambulatory Visit: Payer: Self-pay | Admitting: *Deleted

## 2014-10-23 ENCOUNTER — Encounter: Payer: Self-pay | Admitting: Internal Medicine

## 2014-10-23 MED ORDER — DIPHENOXYLATE-ATROPINE 2.5-0.025 MG PO TABS: 1.0000 | ORAL_TABLET | Freq: Four times a day (QID) | ORAL | Status: AC | PRN

## 2014-10-24 ENCOUNTER — Other Ambulatory Visit: Payer: Self-pay | Admitting: Internal Medicine

## 2014-10-24 ENCOUNTER — Other Ambulatory Visit: Payer: Self-pay

## 2014-10-24 ENCOUNTER — Encounter (HOSPITAL_COMMUNITY): Payer: Self-pay | Admitting: *Deleted

## 2014-10-24 ENCOUNTER — Emergency Department (HOSPITAL_COMMUNITY)

## 2014-10-24 ENCOUNTER — Telehealth: Payer: Self-pay | Admitting: *Deleted

## 2014-10-24 ENCOUNTER — Emergency Department (HOSPITAL_COMMUNITY)
Admission: EM | Admit: 2014-10-24 | Discharge: 2014-11-08 | Disposition: E | Attending: Emergency Medicine | Admitting: Emergency Medicine

## 2014-10-24 DIAGNOSIS — I2119 ST elevation (STEMI) myocardial infarction involving other coronary artery of inferior wall: Secondary | ICD-10-CM | POA: Diagnosis not present

## 2014-10-24 DIAGNOSIS — Z87438 Personal history of other diseases of male genital organs: Secondary | ICD-10-CM | POA: Insufficient documentation

## 2014-10-24 DIAGNOSIS — Z87891 Personal history of nicotine dependence: Secondary | ICD-10-CM | POA: Insufficient documentation

## 2014-10-24 DIAGNOSIS — E119 Type 2 diabetes mellitus without complications: Secondary | ICD-10-CM | POA: Diagnosis not present

## 2014-10-24 DIAGNOSIS — Z8719 Personal history of other diseases of the digestive system: Secondary | ICD-10-CM | POA: Insufficient documentation

## 2014-10-24 DIAGNOSIS — Z794 Long term (current) use of insulin: Secondary | ICD-10-CM | POA: Diagnosis not present

## 2014-10-24 DIAGNOSIS — Z951 Presence of aortocoronary bypass graft: Secondary | ICD-10-CM | POA: Diagnosis not present

## 2014-10-24 DIAGNOSIS — Z79899 Other long term (current) drug therapy: Secondary | ICD-10-CM | POA: Insufficient documentation

## 2014-10-24 DIAGNOSIS — E785 Hyperlipidemia, unspecified: Secondary | ICD-10-CM | POA: Insufficient documentation

## 2014-10-24 DIAGNOSIS — C228 Malignant neoplasm of liver, primary, unspecified as to type: Secondary | ICD-10-CM | POA: Diagnosis not present

## 2014-10-24 DIAGNOSIS — C220 Liver cell carcinoma: Secondary | ICD-10-CM

## 2014-10-24 DIAGNOSIS — Z792 Long term (current) use of antibiotics: Secondary | ICD-10-CM | POA: Diagnosis not present

## 2014-10-24 DIAGNOSIS — I469 Cardiac arrest, cause unspecified: Secondary | ICD-10-CM | POA: Insufficient documentation

## 2014-10-24 DIAGNOSIS — Z7982 Long term (current) use of aspirin: Secondary | ICD-10-CM | POA: Diagnosis not present

## 2014-10-24 DIAGNOSIS — R4182 Altered mental status, unspecified: Secondary | ICD-10-CM | POA: Diagnosis not present

## 2014-10-24 DIAGNOSIS — M109 Gout, unspecified: Secondary | ICD-10-CM | POA: Diagnosis not present

## 2014-10-24 DIAGNOSIS — R570 Cardiogenic shock: Secondary | ICD-10-CM

## 2014-10-24 DIAGNOSIS — I10 Essential (primary) hypertension: Secondary | ICD-10-CM | POA: Diagnosis not present

## 2014-10-24 LAB — CBC WITH DIFFERENTIAL/PLATELET
BASOS PCT: 0 %
Basophils Absolute: 0 10*3/uL (ref 0.0–0.1)
EOS ABS: 0 10*3/uL (ref 0.0–0.7)
Eosinophils Relative: 0 %
HCT: 44.1 % (ref 39.0–52.0)
Hemoglobin: 14.9 g/dL (ref 13.0–17.0)
Lymphocytes Relative: 47 %
Lymphs Abs: 4.3 10*3/uL — ABNORMAL HIGH (ref 0.7–4.0)
MCH: 30.3 pg (ref 26.0–34.0)
MCHC: 33.8 g/dL (ref 30.0–36.0)
MCV: 89.6 fL (ref 78.0–100.0)
MONO ABS: 0.2 10*3/uL (ref 0.1–1.0)
Monocytes Relative: 2 %
NEUTROS ABS: 4.6 10*3/uL (ref 1.7–7.7)
Neutrophils Relative %: 51 %
PLATELETS: 133 10*3/uL — AB (ref 150–400)
RBC: 4.92 MIL/uL (ref 4.22–5.81)
RDW: 20.9 % — AB (ref 11.5–15.5)
WBC: 9.1 10*3/uL (ref 4.0–10.5)

## 2014-10-24 LAB — I-STAT TROPONIN, ED
TROPONIN I, POC: 0.16 ng/mL — AB (ref 0.00–0.08)
TROPONIN I, POC: 0.94 ng/mL — AB (ref 0.00–0.08)

## 2014-10-24 LAB — BASIC METABOLIC PANEL
Anion gap: 13 (ref 5–15)
BUN: 22 mg/dL — AB (ref 6–20)
CHLORIDE: 117 mmol/L — AB (ref 101–111)
CO2: 12 mmol/L — ABNORMAL LOW (ref 22–32)
Calcium: 5.9 mg/dL — CL (ref 8.9–10.3)
Creatinine, Ser: 1.61 mg/dL — ABNORMAL HIGH (ref 0.61–1.24)
GFR calc Af Amer: 46 mL/min — ABNORMAL LOW (ref >60)
GFR calc non Af Amer: 40 mL/min — ABNORMAL LOW (ref >60)
GLUCOSE: 88 mg/dL (ref 65–99)
POTASSIUM: 3.5 mmol/L (ref 3.5–5.1)
SODIUM: 142 mmol/L (ref 135–145)

## 2014-10-24 LAB — I-STAT ARTERIAL BLOOD GAS, ED
ACID-BASE DEFICIT: 23 mmol/L — AB (ref 0.0–2.0)
BICARBONATE: 8.5 meq/L — AB (ref 20.0–24.0)
O2 Saturation: 93 %
TCO2: 10 mmol/L (ref 0–100)
pCO2 arterial: 36.9 mmHg (ref 35.0–45.0)
pH, Arterial: 6.965 — CL (ref 7.350–7.450)
pO2, Arterial: 97 mmHg (ref 80.0–100.0)

## 2014-10-24 LAB — I-STAT CHEM 8, ED
BUN: 48 mg/dL — ABNORMAL HIGH (ref 6–20)
CALCIUM ION: 0.73 mmol/L — AB (ref 1.13–1.30)
Chloride: 114 mmol/L — ABNORMAL HIGH (ref 101–111)
Creatinine, Ser: 1.6 mg/dL — ABNORMAL HIGH (ref 0.61–1.24)
GLUCOSE: 92 mg/dL (ref 65–99)
HEMATOCRIT: 50 % (ref 39.0–52.0)
HEMOGLOBIN: 17 g/dL (ref 13.0–17.0)
POTASSIUM: 6.6 mmol/L — AB (ref 3.5–5.1)
SODIUM: 134 mmol/L — AB (ref 135–145)
TCO2: 8 mmol/L (ref 0–100)

## 2014-10-24 LAB — I-STAT CG4 LACTIC ACID, ED: LACTIC ACID, VENOUS: 10.53 mmol/L — AB (ref 0.5–2.0)

## 2014-10-24 LAB — PROTIME-INR
INR: 3.46 — ABNORMAL HIGH (ref 0.00–1.49)
Prothrombin Time: 34.1 seconds — ABNORMAL HIGH (ref 11.6–15.2)

## 2014-10-24 MED ORDER — NOREPINEPHRINE BITARTRATE 1 MG/ML IV SOLN
0.0000 ug/min | Freq: Once | INTRAVENOUS | Status: AC
  Administered 2014-10-24: 40 ug/min via INTRAVENOUS
  Administered 2014-10-24: 20 ug/min via INTRAVENOUS
  Filled 2014-10-24: qty 4

## 2014-10-24 MED ORDER — FENTANYL CITRATE (PF) 100 MCG/2ML IJ SOLN
INTRAMUSCULAR | Status: AC
  Filled 2014-10-24: qty 2

## 2014-10-24 MED ORDER — SODIUM CHLORIDE 0.9 % IV SOLN
1.0000 g | Freq: Once | INTRAVENOUS | Status: AC
  Administered 2014-10-24: 1 g via INTRAVENOUS
  Filled 2014-10-24: qty 10

## 2014-10-24 MED ORDER — MORPHINE SULFATE (CONCENTRATE) 20 MG/ML PO SOLN: ORAL | Status: AC

## 2014-10-24 MED ORDER — FENTANYL CITRATE (PF) 100 MCG/2ML IJ SOLN
100.0000 ug | Freq: Once | INTRAMUSCULAR | Status: AC
  Administered 2014-10-24: 100 ug via INTRAVENOUS

## 2014-10-24 MED ORDER — LORAZEPAM 2 MG/ML IJ SOLN
INTRAMUSCULAR | Status: AC
  Filled 2014-10-24: qty 1

## 2014-10-24 MED ORDER — SODIUM CHLORIDE 0.9 % IV BOLUS (SEPSIS)
1000.0000 mL | Freq: Once | INTRAVENOUS | Status: AC
  Administered 2014-10-24: 1000 mL via INTRAVENOUS

## 2014-10-24 MED ORDER — NOREPINEPHRINE BITARTRATE 1 MG/ML IV SOLN
0.0000 ug/min | INTRAVENOUS | Status: DC
  Filled 2014-10-24: qty 16

## 2014-10-24 MED ORDER — EPINEPHRINE HCL 1 MG/ML IJ SOLN
0.5000 ug/min | INTRAMUSCULAR | Status: DC
  Administered 2014-10-24: 5 ug/min via INTRAVENOUS
  Filled 2014-10-24: qty 4

## 2014-10-24 MED ORDER — SODIUM BICARBONATE 8.4 % IV SOLN
50.0000 meq | Freq: Once | INTRAVENOUS | Status: AC
  Administered 2014-10-24: 50 meq via INTRAVENOUS
  Filled 2014-10-24: qty 50

## 2014-10-24 MED ORDER — LORAZEPAM 2 MG/ML IJ SOLN
1.0000 mg | Freq: Once | INTRAMUSCULAR | Status: AC
  Administered 2014-10-24: 1 mg via INTRAVENOUS

## 2014-10-24 MED ORDER — SODIUM CHLORIDE 0.9 % IV BOLUS (SEPSIS)
1000.0000 mL | Freq: Once | INTRAVENOUS | Status: AC
Start: 2014-10-24 — End: 2014-10-24
  Administered 2014-10-24: 1000 mL via INTRAVENOUS

## 2014-10-24 MED ORDER — SODIUM CHLORIDE 0.9 % IV SOLN
10.0000 mg/h | INTRAVENOUS | Status: DC
  Administered 2014-10-24: 10 mg/h via INTRAVENOUS
  Filled 2014-10-24: qty 10

## 2014-10-28 ENCOUNTER — Encounter (INDEPENDENT_AMBULATORY_CARE_PROVIDER_SITE_OTHER): Payer: Medicare Other | Admitting: Ophthalmology

## 2014-11-08 NOTE — Progress Notes (Signed)
Pt transported to CT without complication.

## 2014-11-08 NOTE — Progress Notes (Signed)
RT called to remove ETT after withdraw request of family. RN at bedside.

## 2014-11-08 NOTE — ED Notes (Signed)
Pt pronounced at this time by Dr. Reather Converse. TOD 2205.

## 2014-11-08 NOTE — Consult Note (Addendum)
Neurology Consultation Reason for Consult: Prognosis Referring Physician: Rosalyn Gess  CC: Arrest  History is obtained from:chart, referring providers  HPI: Angel Franklin is a 74 y.o. male who suffered a cardiac arrest with approximately 1 hour downtime. He had CPR performed during that timeframe. Of note, the patient has newly diagnosed hepatocellular carcinoma and was considering starting on palliative chemotherapy.  He was at home tonight when he had a witnessed arrest with approximately 1 hour  prior to return of spontaneous pulses. apparently it showed V. fib on monitor.   In the emergency room, he began spontaneously moving and was reaching across his right side with his left arm. due to this he was given sedation with Versed and fentanyl. neurology has been consulted for further recommendations    ROS:  Unable to obtain due to altered mental status.   Past Medical History  Diagnosis Date  . Diabetes mellitus without complication   . Hypertension   . Hyperlipidemia   . Gout   . BPH (benign prostatic hypertrophy)   . PUD (peptic ulcer disease)   . Pancreatitis, chronic   . Ulcerative colitis   . Heart disease      Family History  Problem Relation Age of Onset  . Prostate cancer Father   . Heart attack Father      Social History:  reports that he quit smoking about 21 years ago. He quit smokeless tobacco use about 21 years ago. He reports that he does not drink alcohol or use illicit drugs.   Exam: Current vital signs: BP 86/62 mmHg  Pulse 89  Resp 28  Ht 6\' 1"  (1.854 m)  SpO2 91% Vital signs in last 24 hours: Pulse Rate:  [26-128] 89 (09/16 2110) Resp:  [13-33] 28 (09/16 2110) BP: (51-124)/(18-92) 86/62 mmHg (09/16 2110) SpO2:  [68 %-100 %] 91 % (09/16 2110) FiO2 (%):  [100 %] 100 % (09/16 2005)  Physical Exam  Constitutional: Appears well-developed and well-nourished.  Psych: comatose Eyes: No scleral injection HENT: ET tube in place  Head:  Normocephalic.  Cardiovascular: Normal rate and regular rhythm.  Respiratory:ventilated GI: Soft.  No distension. There is no tenderness.  Skin: WDI    exam limited due to recent bolus of sedating medications. Neuro: Mental Status: does not open eyes or follow commands.  Cranial Nerves: II: does not blink to threat Pupils are equal, round, and reactive to light.   III,IV, VI:  doll's eye intact V, VII:  corneals intact  VIII, X, XI, XII: Unable to assess secondary to patient's altered mental status.  Motor: No response to noxious stimuli  Sensory:  as above Cerebellar: Unable to obtain due to altered mental status.     I have reviewed labs in epic and the results pertinent to this consultation are: Severe hypocalcemia   I have reviewed the images obtained: CT head-unremarkable  Impress: 74 year old male  with cardiac arrest with prolonged down time. At this point, any question about prognosis of a neurological nature would be grossly premature given his intact brainstem. The description of the movements  that he was having do not sound like seizure activity, but rather sounds quasi-purposeful. Calcium is being managed by ED/CCM.   Recommendations: 1) EEG 2) neurology will continue to follow   Roland Rack, MD [facility] [phone]  If 7pm- 7am, please page neurology on call as listed in [place].

## 2014-11-08 NOTE — ED Notes (Signed)
Pt pulseless; EDP made aware; resp aware to remove ventilator

## 2014-11-08 NOTE — ED Notes (Signed)
CCM talking to family outside room.

## 2014-11-08 NOTE — Telephone Encounter (Signed)
Spoke to pt and she states that husband weak, legs are like a rubberband. He is having diarrhea and she is giving him 4 imodium a day with no relief. He does not eat, drinks sips of water. She needs help.  I told her I would call [facility] and let her know.  Dr. Ma Hillock states to stop nexavar and call in lomotil for the diarrhea.  Offer fluids for tom. And ask about hospice.  I called in the lomotil, I checked with infusion and there are no chairs unless someone cancels.  I called back and spoke to wife and she will stop the nexavar and I discussed the fluids but it is only if we have a cancellation. I can check with chemo and see if we can work something out even if it is in afternoon.  Wife wants to know how can that fix things and make him better.  I told her that with his little intake and the amount of diarrhea that she describes that he is probably dehydrated and it may make him feel a little better but the overall prognosis of him with weakness and liver cancer that is most likely not to improve.  When I mentioned hospice she said that means a death sentence.  I told her that in some cases when we send pt to hospice we do have some that get better and come off but not that many.  I would like for her to look at it that she needs help in the home and this is an option where you have people come out to the home and try to manage sx that pt has to make him more comfortable.  She said let her think about it and call her with status of fluids in am.

## 2014-11-08 NOTE — Telephone Encounter (Signed)
Called to say is pt any better and she said no. He had diarrhea stool in bed this am.  I told her at this moment no pt has cancelled treatment. I will keep monitoring and she states that she does not think it will help.  She states that pt asked her how long did the doctor say I was going to die.  Karlene responded by saying no one has said a time frame for you but she feels that on that day she knew he has given up and stopped eating as much and drinking very little.  The diarrhea is not helping the situation but she will stop and get rx for lomotil but she would like hospice to come in.  I asked if it would be ok to send ref. Over and would she like them to come today and she was agreeable to this.  I called hospice and spoke to [place] and she will send someone out today and the hospice form filled out and info faxed to them while I had Gregary Signs on the phone.

## 2014-11-08 NOTE — Telephone Encounter (Signed)
Otila Kluver informed of Roxanol rx being faxed as requested. Left msg on vm

## 2014-11-08 NOTE — ED Notes (Addendum)
Family at bedside with chaplain  

## 2014-11-08 NOTE — Progress Notes (Signed)
   01-Nov-2014 1800  Clinical Encounter Type  Visited With Family;Health care provider  Visit Type Trauma  Referral From Nurse  Stress Factors  Patient Stress Factors Other (Comment)  Family Stress Factors (Awaiting results from doctor)  Chaplain responded to page for trauma; Chaplain escorted family to consultation room; Chaplain will be around with family

## 2014-11-08 NOTE — Progress Notes (Signed)
S: Called by ED for admission. Family distraught, talking with cardiology. Wife stated "I don't want him to exist anymore." Son stated he did want him to suffer. Explained process of terminal extubation and transition to comfort measures.  O: Patient with non-purposeful movement, declining hemodynamics.  A/P: Impending death. Family aware, going to say final goodbyes. Transition to full comfort measures. Patient DNR.  CRITICAL CARE Performed by: Luz Brazen   Total critical care time: 35 minutes  Critical care time was exclusive of separately billable procedures and treating other patients.  Critical care was necessary to treat or prevent imminent or life-threatening deterioration.  Critical care was time spent personally by me on the following activities: development of treatment plan with patient and/or surrogate as well as nursing, discussions with consultants, evaluation of patient's response to treatment, examination of patient, obtaining history from patient or surrogate, ordering and performing treatments and interventions, ordering and review of laboratory studies, ordering and review of radiographic studies, pulse oximetry and re-evaluation of patient's condition.

## 2014-11-08 NOTE — ED Notes (Signed)
Family at bedside. Plan discussed with CCM.

## 2014-11-08 NOTE — ED Notes (Signed)
Dr. Reather Converse made aware the family has more questions

## 2014-11-08 NOTE — ED Notes (Signed)
Pt returned from [facility] with this RN. Only able to complete CT head at this time d/t lack of peripheral IV. Unable to give CT contrast through CVC

## 2014-11-08 NOTE — ED Notes (Signed)
Per EMS: pt at home was sitting with family, pt took a big gulp of air then went unresponsive, epi x8, 450 amioderone, fine V-fib, shocked x 1, IO right leg, EMS called at 1710, CPR started at 1711, EMS arrival 1720, pulses back at 1815, 21 capnography, pt c/o CP earlier today, pt currently being paced at 70 bpm, 140 milliamps, 8.0 ETT 24 at the teeth.

## 2014-11-08 NOTE — ED Notes (Signed)
MD at bedside. 

## 2014-11-08 NOTE — ED Provider Notes (Signed)
CSN: [id number]     Arrival date & time November 08, 2014  1816 History   First MD Initiated Contact with Patient 11-08-14 1913     Chief Complaint  Patient presents with  . Cardiac Arrest     (Consider location/radiation/quality/duration/timing/severity/associated sxs/prior Treatment) HPI Comments: 74 year old male with history of high blood pressure, CABG, hepatocellular carcinoma with chemotherapy approximately 4 weeks ago per EMS and family report, pancreatic cancer presents after witnessed cardiac arrest by family. Patient had sudden onset of gasping and lost pulse. CPR initiated soon after event, continued by EMS with continued CPR and multiple epinephrine doses. Possible brief V. fib, shocked given in the field and amiodarone given. Patient did regain brief pulse after pacing. No EKG in the field. Patient follow code according to EMS. Past smoker in the chart.  The history is provided by the EMS personnel and a relative.    Past Medical History  Diagnosis Date  . Diabetes mellitus without complication   . Hypertension   . Hyperlipidemia   . Gout   . BPH (benign prostatic hypertrophy)   . PUD (peptic ulcer disease)   . Pancreatitis, chronic   . Ulcerative colitis   . Heart disease    Past Surgical History  Procedure Laterality Date  . Appendectomy    . Coronary artery bypass graft      1995  . Total hip arthroplasty Right 06/21/13  . Cholecystectomy     Family History  Problem Relation Age of Onset  . Prostate cancer Father   . Heart attack Father    Social History  Substance Use Topics  . Smoking status: Former Smoker    Quit date: 06/07/1993  . Smokeless tobacco: Former Systems developer    Quit date: 06/07/1993  . Alcohol Use: No     Comment: quit drinking in Nov 1975    Review of Systems  Unable to perform ROS: Acuity of condition      Allergies  Propofol  Home Medications   Prior to Admission medications   Medication Sig Start Date End Date Taking? Authorizing  Bryanne Riquelme  acetaminophen (TYLENOL) 500 MG tablet Take 1,000 mg by mouth every 6 (six) hours as needed.    Historical Mikal Blasdell, MD  allopurinol (ZYLOPRIM) 300 MG tablet Take 300 mg by mouth daily.    Historical Yovanna Cogan, MD  amoxicillin-clavulanate (AUGMENTIN) 500-125 MG per tablet Take 1 tablet (500 mg total) by mouth 2 (two) times daily. 10/01/14   Leia Alf, MD  aspirin EC 81 MG tablet Take 81 mg by mouth daily.    Historical Cedric Mcclaine, MD  atenolol (TENORMIN) 25 MG tablet Take by mouth daily.    Historical Olegario Emberson, MD  diphenoxylate-atropine (LOMOTIL) 2.5-0.025 MG per tablet Take 1 tablet by mouth 4 (four) times daily as needed for diarrhea or loose stools. 10/23/14   Leia Alf, MD  fentaNYL (DURAGESIC - DOSED MCG/HR) 12 MCG/HR Place 1 patch (12.5 mcg total) onto the skin every 3 (three) days. Patient not taking: Reported on 10/01/2014 09/12/14   Leia Alf, MD  ferrous sulfate 325 (65 FE) MG tablet Take 325 mg by mouth daily with breakfast.    Historical Kathy Wahid, MD  insulin aspart (NOVOLOG) 100 UNIT/ML injection Inject 10 Units into the skin 3 (three) times daily before meals.    Historical Deaglan Lile, MD  insulin glargine (LANTUS) 100 UNIT/ML injection Inject 30 Units into the skin at bedtime.    Historical Elliott Quade, MD  loperamide (IMODIUM) 2 MG capsule Take by mouth as needed for diarrhea  or loose stools.    Historical Wynona Duhamel, MD  megestrol (MEGACE) 400 MG/10ML suspension Take 10 mLs (400 mg total) by mouth daily. 10/01/14   Leia Alf, MD  mesalamine (ASACOL) 400 MG EC tablet Take 400 mg by mouth 3 (three) times daily. 2 tablets by mouth three times a day    Historical Ambria Mayfield, MD  morphine (ROXANOL) 20 MG/ML concentrated solution Take 5 - 10 mg  (0.25 - 0.5 mL) orally once every 1 - 2 hours as needed for pain or respiratory distress. 11-17-2014   Leia Alf, MD  Multiple Vitamin (MULTIVITAMIN) tablet Take 1 tablet by mouth daily.    Historical Audyn Dimercurio, MD  ondansetron  (ZOFRAN) 4 MG tablet Take 1 tablet (4 mg total) by mouth every 4 (four) hours as needed for nausea or vomiting. 10/02/14   Leia Alf, MD  pravastatin (PRAVACHOL) 40 MG tablet Take 40 mg by mouth daily.    Historical Tyjay Galindo, MD  predniSONE (DELTASONE) 20 MG tablet  06/09/14   Historical Oneika Simonian, MD  predniSONE (DELTASONE) 5 MG tablet Take 5 mg by mouth daily with breakfast.    Historical Oretta Berkland, MD  SORAfenib (NEXAVAR) 200 MG tablet Take 400 mg by mouth 2 (two) times daily. Give on an empty stomach 1 hour before or 2 hours after meals.    Historical Marshell Dilauro, MD  vitamin B-12 (CYANOCOBALAMIN) 250 MCG tablet Take 250 mcg by mouth daily.    Historical Espiridion Supinski, MD   BP 53/32 mmHg  Pulse 81  Resp 0  Ht 6\' 1"  (1.854 m)  SpO2 91% Physical Exam  Constitutional: He appears well-nourished.  HENT:  Head: Atraumatic.  Dry mucous membranes  Eyes: Right eye exhibits no discharge. Left eye exhibits no discharge.  Neck: Neck supple. No JVD present.  Cardiovascular: Normal rate.   Pulmonary/Chest: Respiratory distress: intubated. He has no rales.  Patient intubated, vent  Abdominal: He exhibits no distension.  Neurological: GCS eye subscore is 1. GCS verbal subscore is 1. GCS motor subscore is 1.  Initially patient unresponsive, brief agonal respirations, no spontaneous movement on arrival, pupils 4 mm bilateral nonreactive, neck supple  Skin:  Mottled skin head neck and lower extremities cool to the touch  Psychiatric:  Unresponsive  Nursing note and vitals reviewed.   ED Course  CENTRAL LINE Date/Time: 11-17-14 8:41 PM Performed by: Elnora Morrison Authorized by: Elnora Morrison Consent: The procedure was performed in an emergent situation. Written consent not obtained. Consent given by: guardian and spouse Patient identity confirmed: arm band Indications: vascular access Patient sedated: no Preparation: skin prepped with 2% chlorhexidine Skin prep agent dried: skin prep agent  completely dried prior to procedure Sterile barriers: all five maximum sterile barriers used - cap, mask, sterile gown, sterile gloves, and large sterile sheet Location details: right femoral Patient position: flat Catheter size: 7 Fr Ultrasound guidance: yes Number of attempts: 2 Successful placement: yes Post-procedure: line sutured and dressing applied Assessment: blood return through all ports and free fluid flow Patient tolerance: Patient tolerated the procedure well with no immediate complications   (including critical care time) CRITICAL CARE Performed by: Mariea Clonts   Total critical care time: 105 min  Critical care time was exclusive of separately billable procedures and treating other patients.  Critical care was necessary to treat or prevent imminent or life-threatening deterioration.  Critical care was time spent personally by me on the following activities: development of treatment plan with patient and/or surrogate as well as nursing, discussions with consultants, evaluation  of patient's response to treatment, examination of patient, obtaining history from patient or surrogate, ordering and performing treatments and interventions, ordering and review of laboratory studies, ordering and review of radiographic studies, pulse oximetry and re-evaluation of patient's condition.  Emergency Ultrasound Study:   Angiocath insertion Performed by: Mariea Clonts  Consent: Verbal consent obtained. Risks and benefits: risks, benefits and alternatives were discussed Immediately prior to procedure the correct patient, procedure, equipment, support staff and site/side marked as needed.  Indication: difficult IV access Preparation: Patient was prepped and draped in the usual sterile fashion. Vein Location:right ac vein was visualized during assessment for potential access sites and was found to be patent/ easily compressed with linear ultrasound.  The needle was visualized  with real-time ultrasound and guided into the vein. Gauge: 20 g  Image saved and stored.  Normal blood return.  Patient tolerance: Patient tolerated the procedure well with no immediate complications.  Emergency Ultrasound Study:   Angiocath insertion Performed by: Mariea Clonts  Consent: Verbal consent obtained. Risks and benefits: risks, benefits and alternatives were discussed Immediately prior to procedure the correct patient, procedure, equipment, support staff and site/side marked as needed.  Indication: difficult IV access Preparation: Patient was prepped and draped in the usual sterile fashion. Vein Location: right femoral vein was visualized during assessment for potential access sites and was found to be patent/ easily compressed with linear ultrasound.  The needle was visualized with real-time ultrasound and guided into the vein. Gauge: central line  Image saved and stored.  Normal blood return.  Patient tolerance: Patient tolerated the procedure well with no immediate complications.       Labs Review Labs Reviewed  CBC WITH DIFFERENTIAL/PLATELET - Abnormal; Notable for the following:    RDW 20.9 (*)    Platelets 133 (*)    Lymphs Abs 4.3 (*)    All other components within normal limits  PROTIME-INR - Abnormal; Notable for the following:    Prothrombin Time 34.1 (*)    INR 3.46 (*)    All other components within normal limits  BASIC METABOLIC PANEL - Abnormal; Notable for the following:    Chloride 117 (*)    CO2 12 (*)    BUN 22 (*)    Creatinine, Ser 1.61 (*)    Calcium 5.9 (*)    GFR calc non Af Amer 40 (*)    GFR calc Af Amer 46 (*)    All other components within normal limits  I-STAT TROPOININ, ED - Abnormal; Notable for the following:    Troponin i, poc 0.16 (*)    All other components within normal limits  I-STAT CHEM 8, ED - Abnormal; Notable for the following:    Sodium 134 (*)    Potassium 6.6 (*)    Chloride 114 (*)    BUN 48 (*)     Creatinine, Ser 1.60 (*)    Calcium, Ion 0.73 (*)    All other components within normal limits  I-STAT CG4 LACTIC ACID, ED - Abnormal; Notable for the following:    Lactic Acid, Venous 10.53 (*)    All other components within normal limits  I-STAT ARTERIAL BLOOD GAS, ED - Abnormal; Notable for the following:    pH, Arterial 6.965 (*)    Bicarbonate 8.5 (*)    Acid-base deficit 23.0 (*)    All other components within normal limits  I-STAT TROPOININ, ED - Abnormal; Notable for the following:    Troponin i, poc 0.94 (*)  All other components within normal limits  I-STAT CG4 LACTIC ACID, ED    Imaging Review Ct Head Wo Contrast  10/26/14   CLINICAL DATA:  Status post CPR.  EXAM: CT HEAD WITHOUT CONTRAST  TECHNIQUE: Contiguous axial images were obtained from the base of the skull through the vertex without intravenous contrast.  COMPARISON:  10/10/2014  FINDINGS: Stable age related cerebral atrophy, ventriculomegaly and periventricular white matter disease. No extra-axial fluid collections are identified. No CT findings for acute hemispheric infarction or intracranial hemorrhage. No mass lesions. The brainstem and cerebellum are normal.  No acute bony findings. Chronic sinus disease and remote sinonasal surgery.  IMPRESSION: 1. Stable age related cerebral atrophy, ventriculomegaly and periventricular white matter disease. No definite acute intracranial findings. 2. No significant bony findings.  Chronic sinus disease.   Electronically Signed   By: Marijo Sanes M.D.   On: 10/26/14 19:56   Dg Chest Portable 1 View  10/26/2014   CLINICAL DATA:  Acute myocardial infarction status post CPR  EXAM: PORTABLE CHEST - 1 VIEW  COMPARISON:  06/23/2013  FINDINGS: Endotracheal tube 4.6 cm above the carina. Stable mild cardiac enlargement status post CABG. Pronounced bilateral perihilar hazy opacity. Bony thorax appears intact. No pneumothorax appreciated on this supine film. No pleural effusion.   IMPRESSION: Significant acute pulmonary edema   Electronically Signed   By: Skipper Cliche M.D.   On: 10-26-2014 19:11   I have personally reviewed and evaluated these images and lab results as part of my medical decision-making.   EKG Interpretation None     Initial EKG reviewed poor baseline, normal heart rate, overall regular, nonspecific ST and T wave changes  Repeat EKG reviewed heart rate 83, elevation inferiorly and lateral with reciprocal depression, acute MI, prolonged QT, irregular rhythm. MDM   Final diagnoses:  Cardiac arrest  Cardiogenic shock  ST elevation myocardial infarction (STEMI) of inferior wall   Patient presents after witnessed cardiac arrest approximately 55 minutes of CPR. Prior to arrival patient regained pulse with pacing. Difficult IV IV placed in the field. Patient focal code per EMS.  Difficult IV on arrival, ultrasound-guided initially.  Right femoral line placed emergently. Decreased ejection fraction bedside ultrasound. Hypotensive on arrival, norepinephrine titrated improved blood pressure.  Patient intubated in the field, respiratory adjusting vent settings. ABG ordered and reviewed.  Chest x-ray reviewed concern for pulmonary edema. Patient did receive fluid boluses due to persistent hypotension.  Long discussions with family, patient had discussed DO NOT RESUSCITATE however no paperwork done. Patient's wife requested to continue aggressive care at this time. Discussed with critical care for formal consult and neurology as well.  The patients results and plan were reviewed and discussed.   Any x-rays performed were independently reviewed by myself.   Differential diagnosis were considered with the presenting HPI.  Medications  EPINEPHrine (ADRENALIN) 4 mg in dextrose 5 % 250 mL (0.016 mg/mL) infusion (0 mcg/min Intravenous Stopped 2014/10/26 2208)  norepinephrine (LEVOPHED) 16 mg in dextrose 5 % 250 mL (0.064 mg/mL) infusion (not administered)   morphine 250 mg in sodium chloride 0.9 % 250 mL (1 mg/mL) infusion (0 mg/hr Intravenous Stopped Oct 26, 2014 2208)  norepinephrine (LEVOPHED) 4 mg in dextrose 5 % 250 mL (0.016 mg/mL) infusion (0 mcg/min Intravenous Stopped 10-26-14 2208)  sodium bicarbonate injection 50 mEq (50 mEq Intravenous Given 10-26-2014 1951)  sodium chloride 0.9 % bolus 1,000 mL (0 mLs Intravenous Stopped 10-26-14 2049)  sodium chloride 0.9 % bolus 1,000 mL (0 mLs Intravenous Stopped  Oct 28, 2014 2049)  fentaNYL (SUBLIMAZE) injection 100 mcg (100 mcg Intravenous Given October 28, 2014 2009)  LORazepam (ATIVAN) injection 1 mg (1 mg Intravenous Given October 28, 2014 2010)  calcium gluconate 1 g in sodium chloride 0.9 % 100 mL IVPB (0 g Intravenous Stopped 2014-10-28 2108)  sodium chloride 0.9 % bolus 1,000 mL (0 mLs Intravenous Stopped October 28, 2014 2208)    Filed Vitals:   2014-10-28 2140 10/28/14 2146 Oct 28, 2014 2150 10/28/2014 2206  BP: 53/40 50/18 53/32    Pulse:   81   Resp: 21 18 18  0  Height:      SpO2:   91%     Final diagnoses:  Cardiac arrest  Cardiogenic shock  ST elevation myocardial infarction (STEMI) of inferior wall   Repeat EKG with positive troponin, EKG reviewed concerning for acute inferior heart attack. Code STEMI called, discussed with cardiologist at the bedside. Complicated history and situation with prolonged down time and cancer history. Myself, cardiologist and critical care had discussions with family. Decision for comfort care made. Nurse contacted me as patient lost pulse. No CPR initiated. Family made aware and chaplain assisting.  Rhythm strip reviewed asystole.     Elnora Morrison, MD 10-28-14 2212

## 2014-11-08 NOTE — ED Notes (Signed)
Pt starting to roll to the right side and reaching his left arm towards the right.

## 2014-11-08 NOTE — Telephone Encounter (Signed)
Having pain and the Tramadol is making him feel funny. Asking that rx for Roxanol be sent to [facility]

## 2014-11-08 NOTE — ED Notes (Signed)
[facility] has been called for Dr. Reather Converse

## 2014-11-08 NOTE — ED Notes (Signed)
Dr. Reather Converse back to the bedside.

## 2014-11-08 NOTE — ED Notes (Signed)
Pt transported to CT with this RN 

## 2014-11-08 NOTE — ED Notes (Signed)
Morphine drip given to Georgina Snell, Pharmacist to take back and waste.

## 2014-11-08 NOTE — ED Notes (Signed)
Dr. Fletcher Anon at the bedside.

## 2014-11-08 DEATH — deceased

## 2014-11-12 ENCOUNTER — Ambulatory Visit: Payer: Medicare Other | Admitting: Internal Medicine

## 2014-11-12 ENCOUNTER — Other Ambulatory Visit: Payer: Medicare Other

## 2016-12-09 IMAGING — MR MR LUMBAR SPINE WO/W CM
7 series · 35 of 48 positions shown · IV contrast (18ml Multihance)
Comparison: CT Abdomen and Pelvis 08/21/2014, 12/25/2008

CLINICAL DATA: 74-year-old male with lumbar back pain for 3 weeks.
Known pancreatic and liver lesions. Subsequent encounter.

EXAM:
MRI LUMBAR SPINE WITHOUT AND WITH CONTRAST
TECHNIQUE: Multiplanar and multiecho pulse sequences of the lumbar spine were
obtained without and with intravenous contrast.
CONTRAST:  18 mL MultiHance, in conjunction with contrast enhanced
imaging of the abdomen reported separately.

[Series 2: T2 · sagittal · 4.0mm · 0.81mm/px · 5 of 17 slices shown (1 of 2)]
[im 1/17]
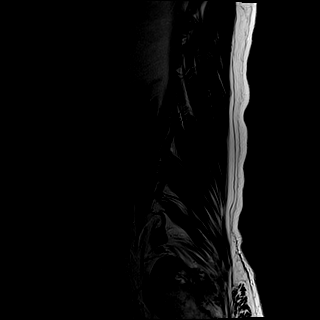
[im 5/17]
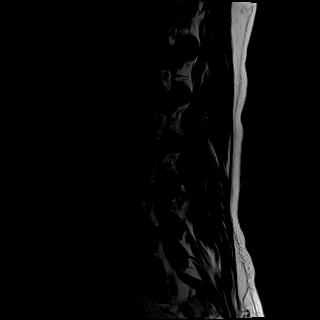
[im 9/17]
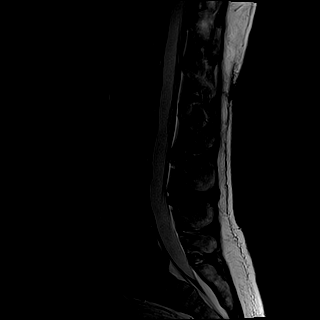
[im 13/17]
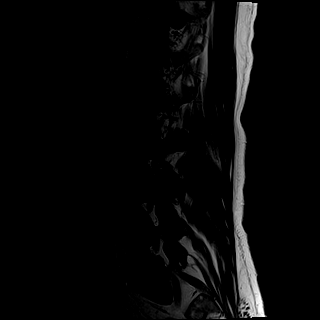
[im 17/17]
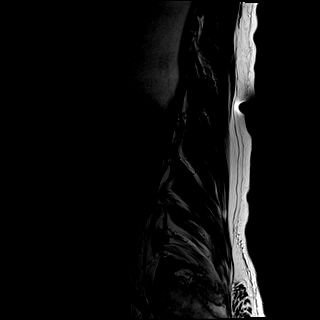

[Series 3: T1 · sagittal · 4.0mm · 0.81mm/px · 5 of 17 slices shown (1 of 2)]
[im 1/17]
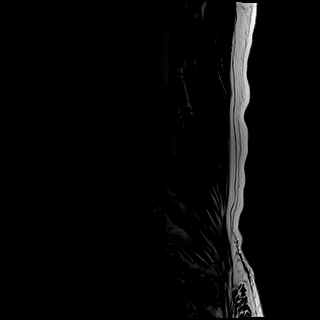
[im 5/17]
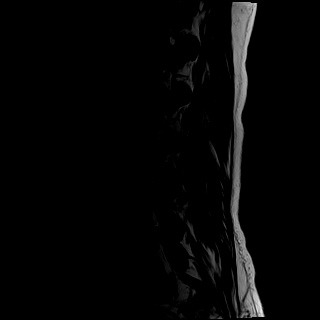
[im 9/17]
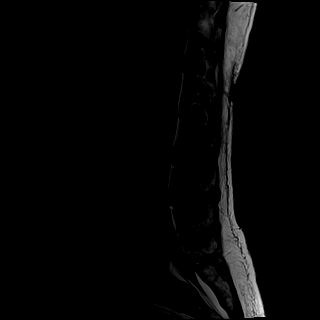
[im 13/17]
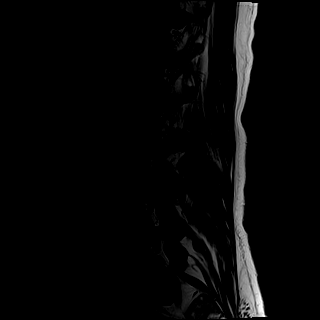
[im 17/17]
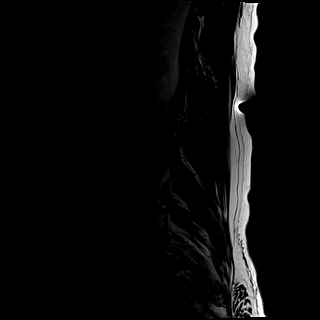

[Series 4: STIR · sagittal · 4.0mm · 1.02mm/px · 4 of 17 slices shown]
[im 1/17]
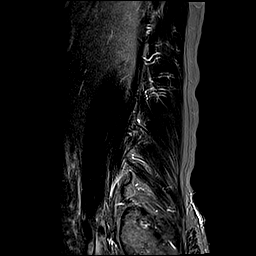
[im 6/17]
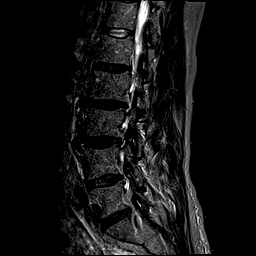
[im 11/17]
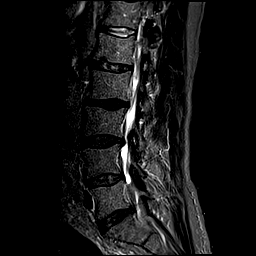
[im 17/17]
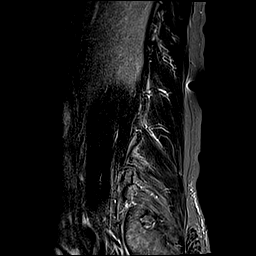

[Series 5: T2 · axial · 4.0mm · 0.78mm/px · z∈[-106,+111]mm · 8 of 38 slices shown (2 of 2)]
[im 1/38]
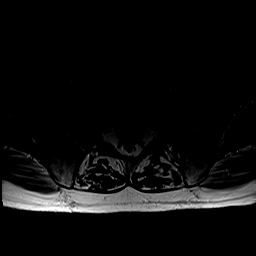
[im 5/38]
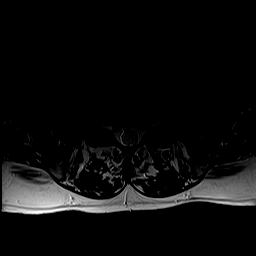
[im 13/38]
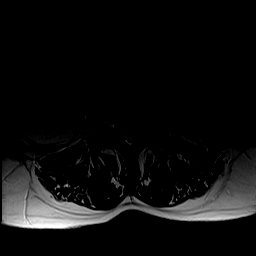
[im 17/38]
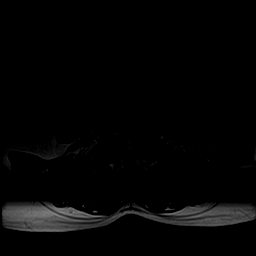
[im 21/38]
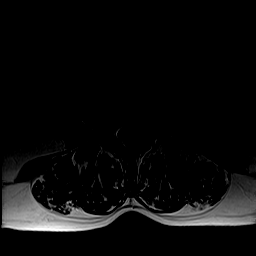
[im 25/38]
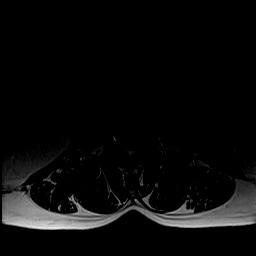
[im 33/38]
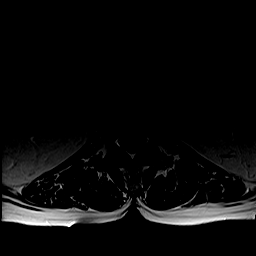
[im 38/38]
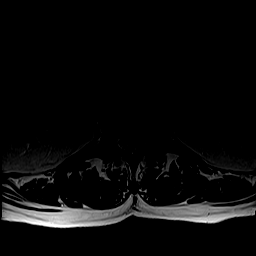

[Series 6: T1 · axial · 4.0mm · 0.39mm/px · z∈[-106,+111]mm · 8 of 38 slices shown (2 of 2)]
[im 1/38]
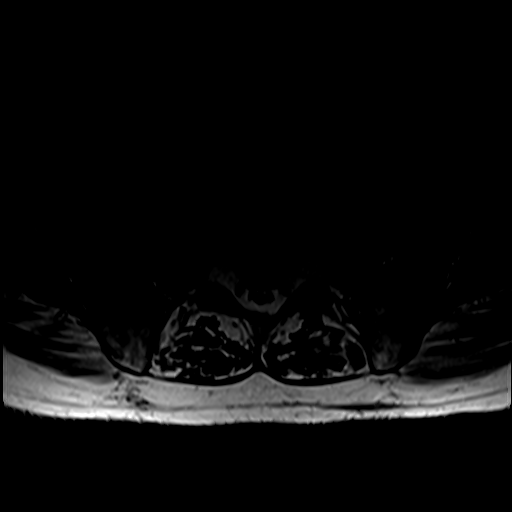
[im 5/38]
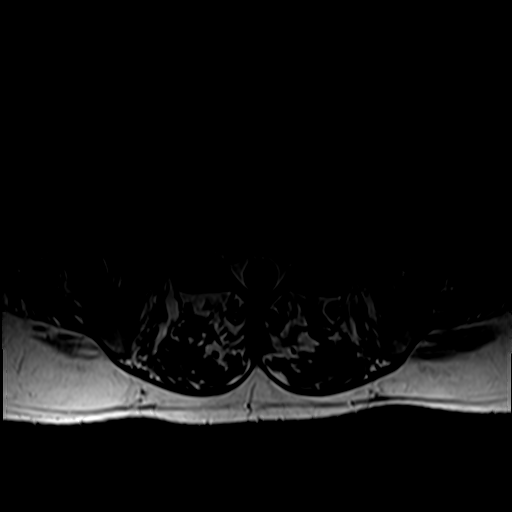
[im 13/38]
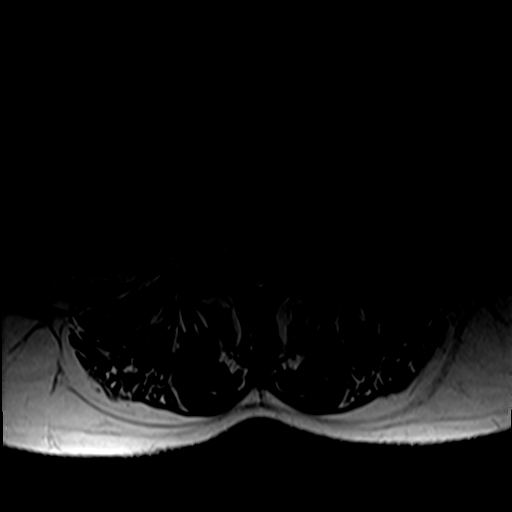
[im 17/38]
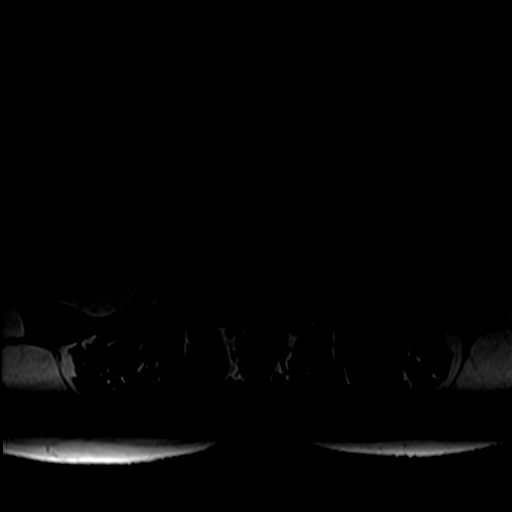
[im 21/38]
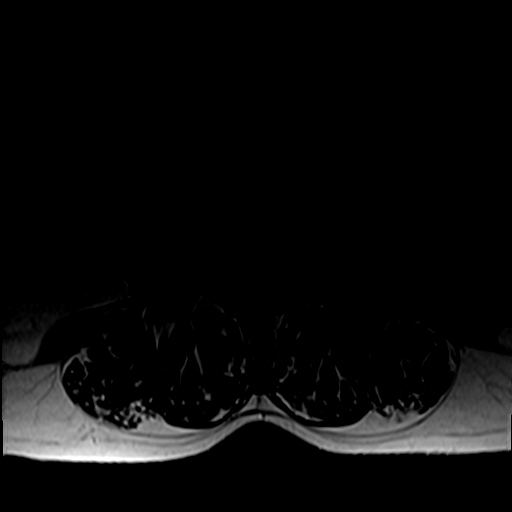
[im 25/38]
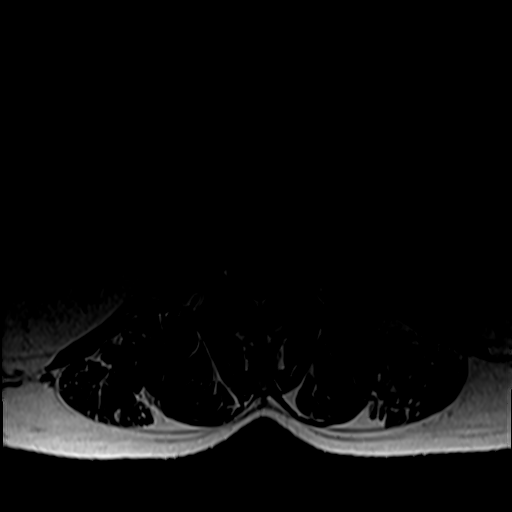
[im 33/38]
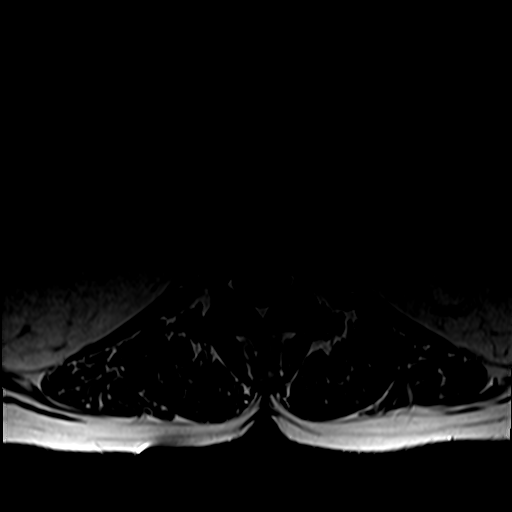
[im 38/38]
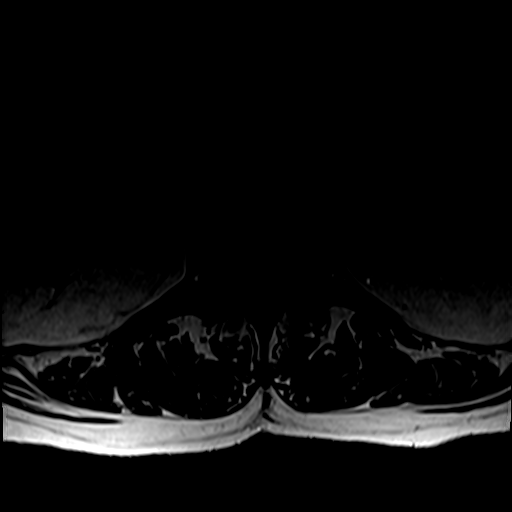

[Series 8: T1 fat-sat post-contrast · sagittal · 4.0mm · 0.81mm/px · 4 of 17 slices shown]
[im 1/17]
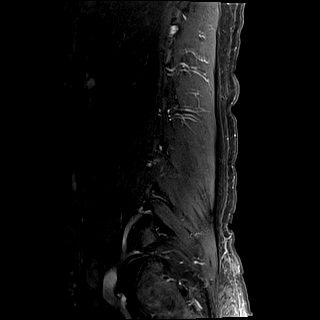
[im 6/17]
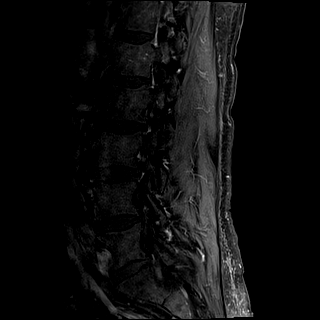
[im 11/17]
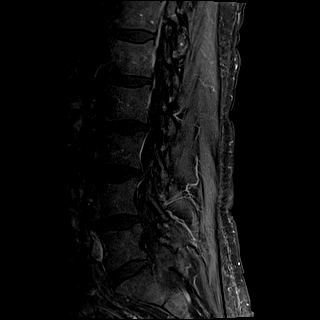
[im 17/17]
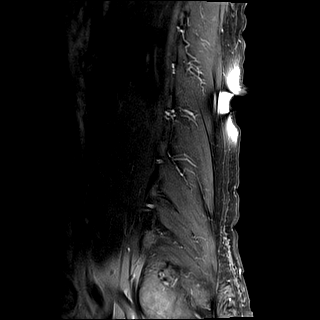

[Series 9: T1 post-contrast · axial · 4.0mm · 0.39mm/px · 1 of 39 slices shown]
[im 1/39]
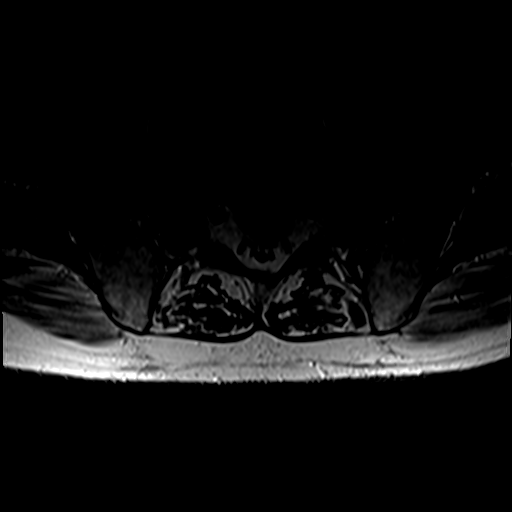

[35 of 48 positions shown; findings below may reference images not displayed]

FINDINGS: Normal lumbar segmentation demonstrated by CT. Stable vertebral
height and alignment, with mild retrolisthesis at L5-S1. Lumbar,
visible lower thoracic, and visible sacral bone marrow signal is
within normal limits. No marrow edema or evidence of acute osseous
abnormality.

Visualized lower thoracic spinal cord is normal with conus medularis
at T12-L1. Normal cauda equina nerve roots. No abnormal intradural
enhancement.

Stable visualized kidneys, abdominal aorta, and IVC. Other abdominal
viscera today are reported with the abdomen. Negative visualized
posterior paraspinal soft tissues.

No lumbar spinal stenosis. There is mild lumbar disc degeneration
from L2-L3 to L5-S1. There is a posterior annular fissure at L3-L4.
There is superimposed up to moderate lower lumbar facet
degeneration, maximal at L4-L5. There is borderline to mild lateral
recess stenosis at L3-L4 and L4-L5. There is mild to moderate right
L3 foraminal stenosis at L3-L4 (series 2, image 5).
IMPRESSION: 1. No metastatic disease or acute osseous abnormality identified in
the lumbar spine.
2. Abdomen MRI from today reported separately.
3. Mild for age lumbar spine degeneration. Mild to moderate right L3
foraminal stenosis, and mild lateral recess stenosis at L3-L4 and
L4-L5.

## 2017-01-23 IMAGING — CT CT HEAD W/O CM
1 series · 15 of 30 positions shown, 19 images · non-contrast
Comparison: None.

CLINICAL DATA: Headache with dizziness and fatigue. No known
injury.

EXAM:
CT HEAD WITHOUT CONTRAST
TECHNIQUE: Contiguous axial images were obtained from the base of the skull
through the vertex without intravenous contrast.

[Series 2: soft tissue · axial · 0.42mm/px · z∈[-669,-534]mm · 15 of 30 slices shown, 19 images]
[im 2/30  brain]
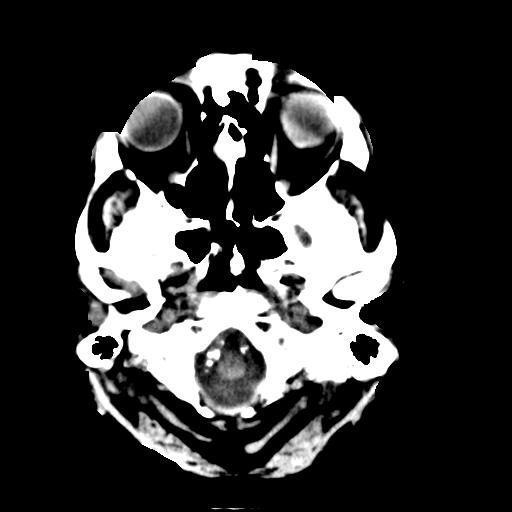
[im 2/30  bone]
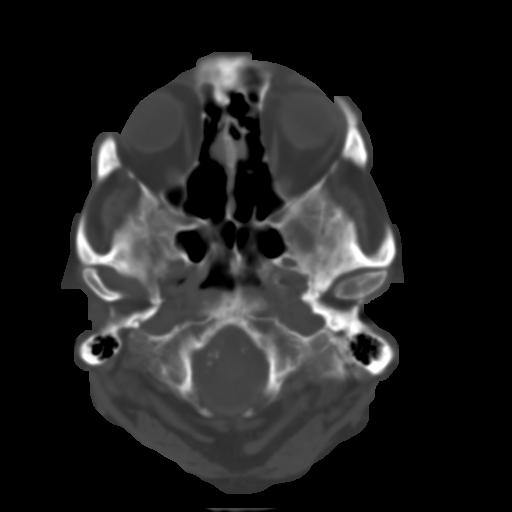
[im 4/30  brain]
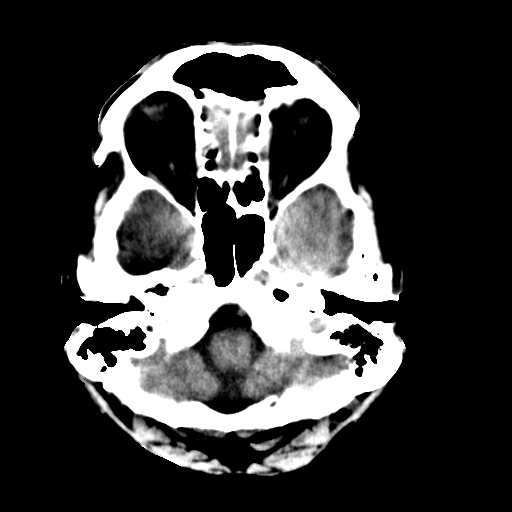
[im 6/30  brain]
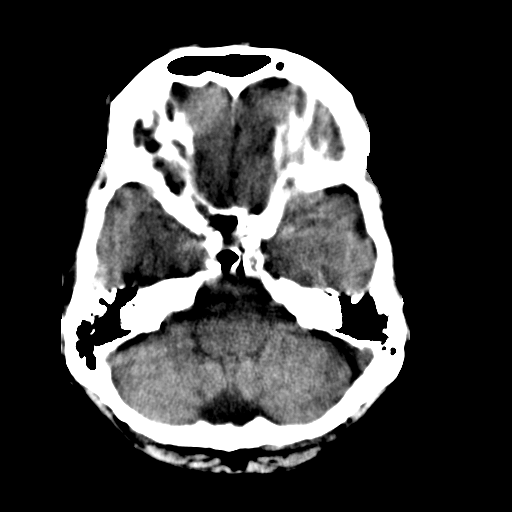
[im 8/30  brain]
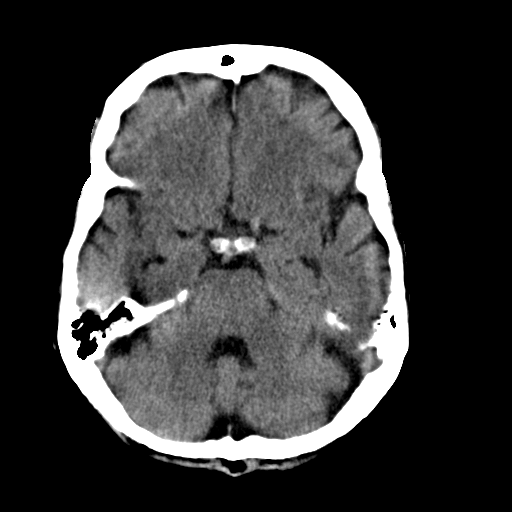
[im 10/30  brain]
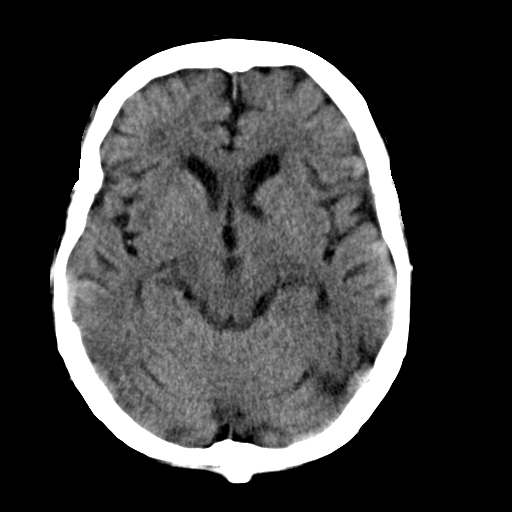
[im 10/30  bone]
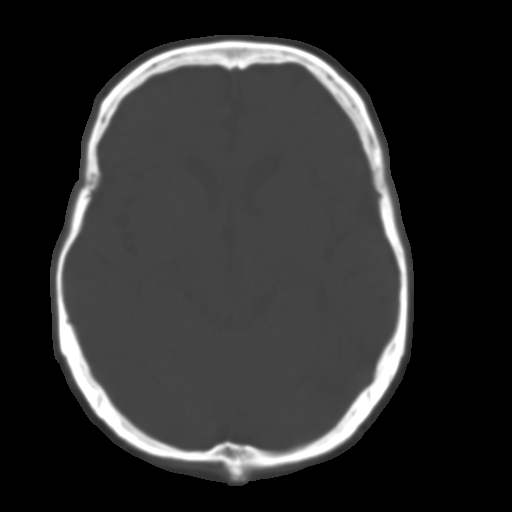
[im 12/30  brain]
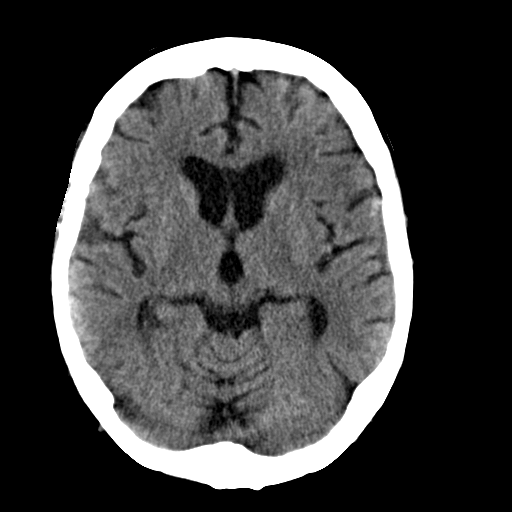
[im 14/30  brain]
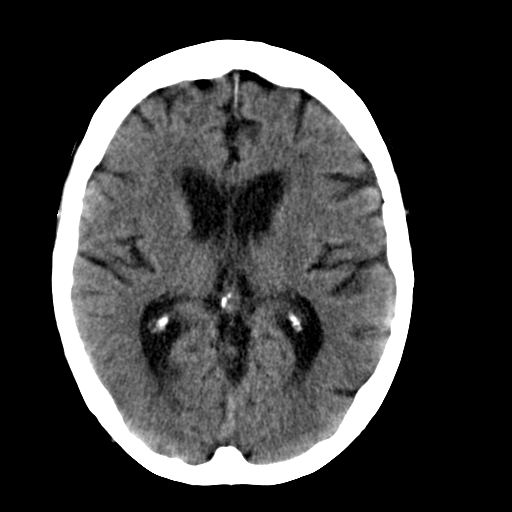
[im 16/30  brain]
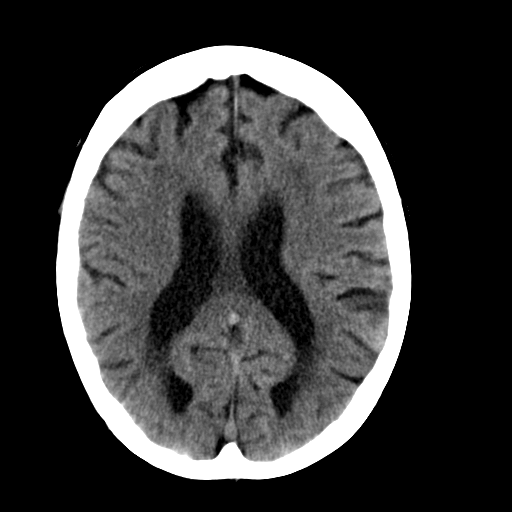
[im 17/30  brain]
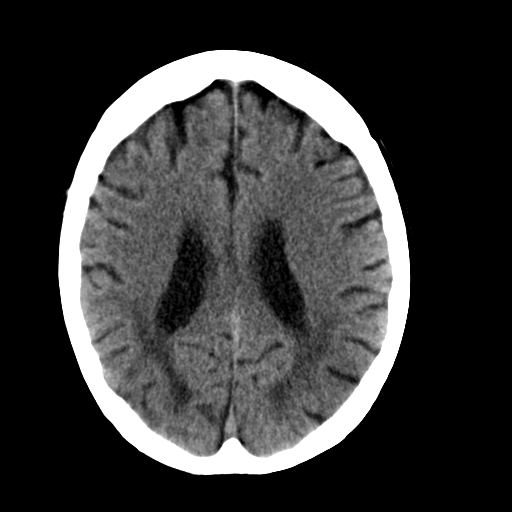
[im 17/30  bone]
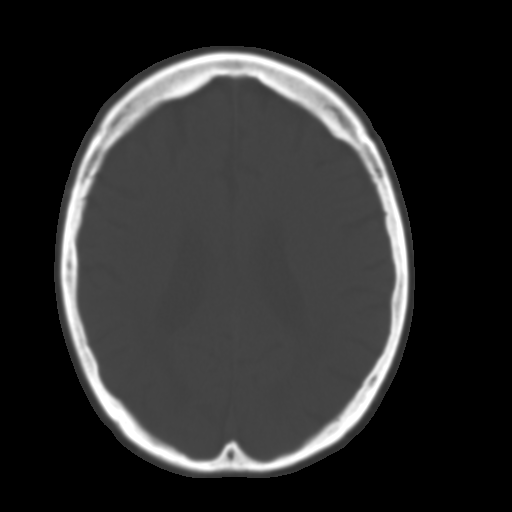
[im 19/30  brain]
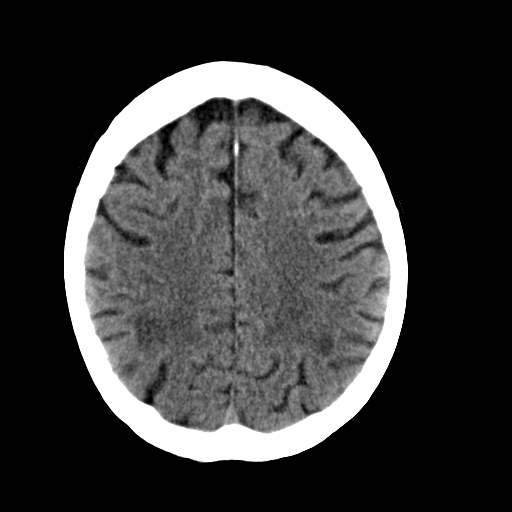
[im 21/30  brain]
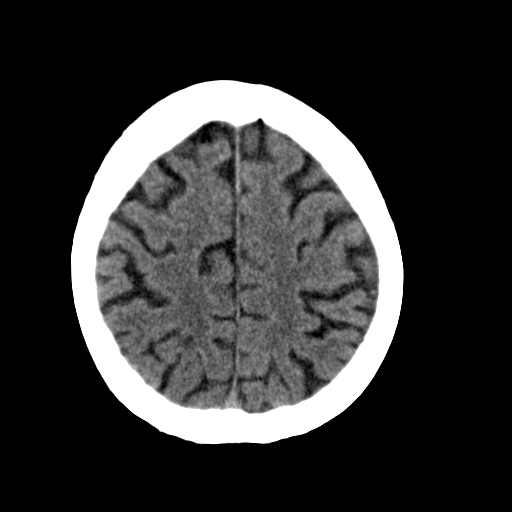
[im 23/30  brain]
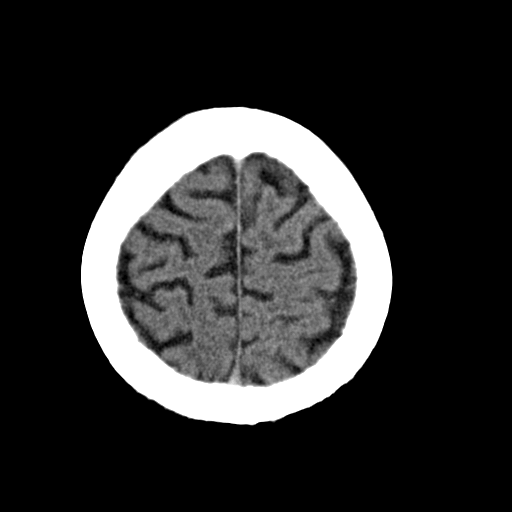
[im 25/30  brain]
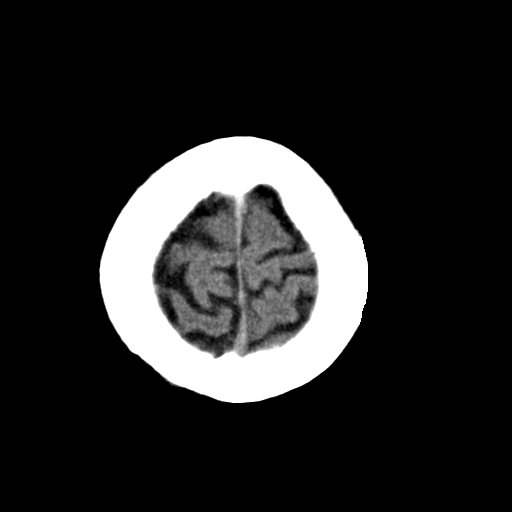
[im 25/30  bone]
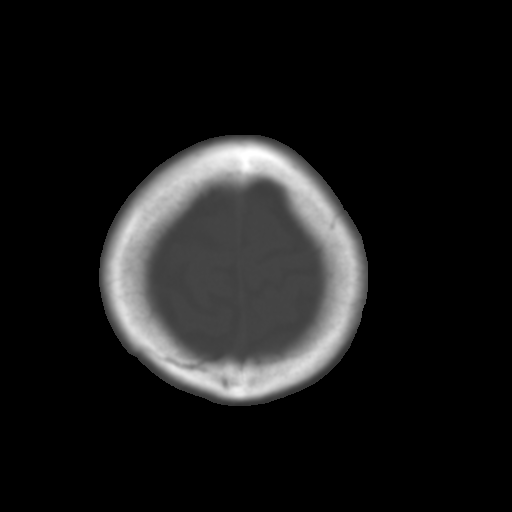
[im 27/30  brain]
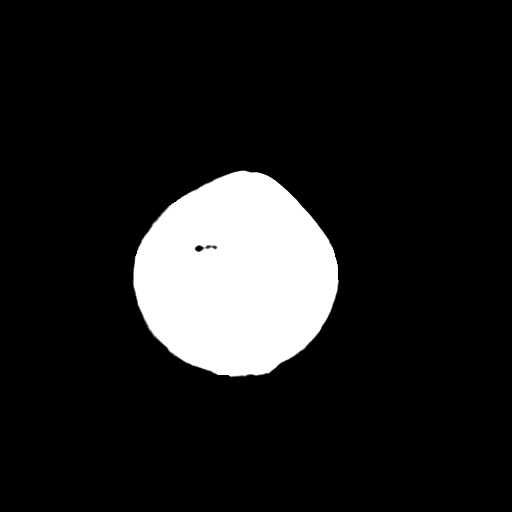
[im 29/30  brain]
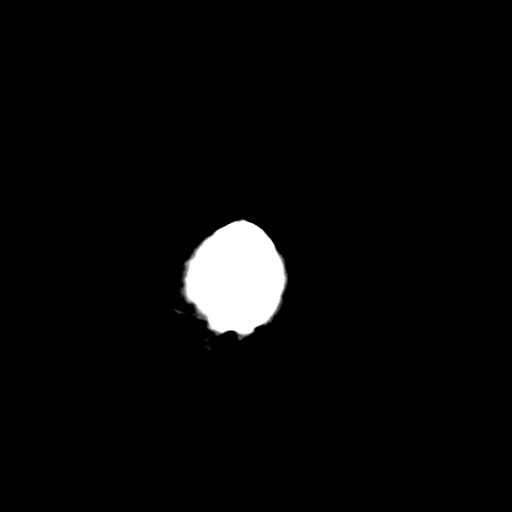

[15 of 30 positions shown; findings below may reference images not displayed]

FINDINGS: There is no evidence of mass effect, midline shift, or extra-axial
fluid collections. There is no evidence of a space-occupying lesion
or intracranial hemorrhage. There is no evidence of a cortical-based
area of acute infarction. There is generalized cerebral atrophy.
There is periventricular white matter low attenuation likely
secondary to microangiopathy.

The ventricles and sulci are appropriate for the patient's age. The
basal cisterns are patent.

Visualized portions of the orbits are unremarkable. There is
evidence of prior sinus surgery. The visualized paranasal sinuses
and mastoid sinuses are clear. Cerebrovascular atherosclerotic
calcifications are noted.

The osseous structures are unremarkable.
IMPRESSION: 1. No acute intracranial pathology.
2. Chronic microvascular disease and cerebral atrophy.
